# Patient Record
Sex: Female | Born: 1950 | Race: White | Hispanic: No | Smoking: Never smoker
Health system: Southern US, Community
[De-identification: ages and names within clinical notes are randomized; demographics above are authoritative.]

## PROBLEM LIST (undated history)

## (undated) DIAGNOSIS — I10 Essential (primary) hypertension: Secondary | ICD-10-CM

## (undated) DIAGNOSIS — K219 Gastro-esophageal reflux disease without esophagitis: Secondary | ICD-10-CM

## (undated) DIAGNOSIS — M5030 Other cervical disc degeneration, unspecified cervical region: Secondary | ICD-10-CM

## (undated) DIAGNOSIS — F419 Anxiety disorder, unspecified: Secondary | ICD-10-CM

## (undated) DIAGNOSIS — E7800 Pure hypercholesterolemia, unspecified: Secondary | ICD-10-CM

## (undated) DIAGNOSIS — Z87442 Personal history of urinary calculi: Secondary | ICD-10-CM

## (undated) DIAGNOSIS — F32A Depression, unspecified: Secondary | ICD-10-CM

## (undated) DIAGNOSIS — D649 Anemia, unspecified: Secondary | ICD-10-CM

## (undated) DIAGNOSIS — R0600 Dyspnea, unspecified: Secondary | ICD-10-CM

## (undated) DIAGNOSIS — M5136 Other intervertebral disc degeneration, lumbar region: Secondary | ICD-10-CM

## (undated) DIAGNOSIS — F329 Major depressive disorder, single episode, unspecified: Secondary | ICD-10-CM

## (undated) HISTORY — PX: KNEE SURGERY: SHX244

## (undated) HISTORY — PX: ANKLE SURGERY: SHX546

## (undated) HISTORY — DX: Gastro-esophageal reflux disease without esophagitis: K21.9

## (undated) HISTORY — DX: Anemia, unspecified: D64.9

## (undated) HISTORY — PX: COLONOSCOPY: SHX174

## (undated) HISTORY — PX: DILATION AND CURETTAGE OF UTERUS: SHX78

---

## 2006-10-23 ENCOUNTER — Inpatient Hospital Stay (HOSPITAL_COMMUNITY): Admission: EM | Admit: 2006-10-23 | Discharge: 2006-10-26 | Payer: Self-pay | Admitting: Emergency Medicine

## 2006-10-23 ENCOUNTER — Ambulatory Visit: Payer: Self-pay | Admitting: Cardiovascular Disease

## 2006-10-25 ENCOUNTER — Encounter (INDEPENDENT_AMBULATORY_CARE_PROVIDER_SITE_OTHER): Payer: Self-pay | Admitting: *Deleted

## 2006-10-25 ENCOUNTER — Ambulatory Visit: Payer: Self-pay | Admitting: Surgery

## 2009-10-04 ENCOUNTER — Emergency Department (HOSPITAL_COMMUNITY): Admission: EM | Admit: 2009-10-04 | Discharge: 2009-10-04 | Payer: Self-pay | Admitting: Emergency Medicine

## 2010-03-28 ENCOUNTER — Emergency Department (HOSPITAL_COMMUNITY)
Admission: EM | Admit: 2010-03-28 | Discharge: 2010-03-28 | Disposition: A | Discharge status: Home / Self Care | Payer: Medicaid Other | Attending: Emergency Medicine | Admitting: Emergency Medicine

## 2010-03-28 DIAGNOSIS — R1011 Right upper quadrant pain: Secondary | ICD-10-CM

## 2010-03-28 DIAGNOSIS — R109 Unspecified abdominal pain: Secondary | ICD-10-CM | POA: Insufficient documentation

## 2010-03-28 LAB — LIPASE, BLOOD: Lipase: 41 U/L (ref 11–59)

## 2010-03-28 LAB — COMPREHENSIVE METABOLIC PANEL
ALT: 24 U/L (ref 0–35)
Albumin: 3.7 g/dL (ref 3.5–5.2)
Alkaline Phosphatase: 74 U/L (ref 39–117)
CO2: 26 mEq/L (ref 19–32)
Calcium: 9.5 mg/dL (ref 8.4–10.5)
GFR calc non Af Amer: >60 mL/min (ref >60)
Total Protein: 8.1 g/dL (ref 6.0–8.3)

## 2010-03-28 LAB — URINALYSIS, ROUTINE W REFLEX MICROSCOPIC
Protein, ur: NEGATIVE mg/dL
Urine Glucose, Fasting: NEGATIVE mg/dL
Urobilinogen, UA: 0.2 mg/dL (ref 0.0–1.0)
pH: 5.5 (ref 5.0–8.0)

## 2010-03-28 LAB — CBC: RDW: 13.4 % (ref 11.5–15.5)

## 2010-03-28 LAB — DIFFERENTIAL
Eosinophils Relative: 2 % (ref 0–5)
Lymphocytes Relative: 39 % (ref 12–46)
Lymphs Abs: 3.8 10*3/uL (ref 0.7–4.0)
Monocytes Relative: 7 % (ref 3–12)
Neutrophils Relative %: 53 % (ref 43–77)

## 2010-03-29 ENCOUNTER — Other Ambulatory Visit (HOSPITAL_COMMUNITY): Payer: Self-pay | Admitting: Family Medicine

## 2010-03-29 ENCOUNTER — Ambulatory Visit (HOSPITAL_COMMUNITY): Payer: Medicaid Other

## 2010-03-29 DIAGNOSIS — R1011 Right upper quadrant pain: Secondary | ICD-10-CM

## 2010-04-04 ENCOUNTER — Encounter (HOSPITAL_COMMUNITY): Payer: Self-pay

## 2010-04-04 ENCOUNTER — Encounter (HOSPITAL_COMMUNITY)
Admission: RE | Admit: 2010-04-04 | Discharge: 2010-04-04 | Disposition: A | Discharge status: Home / Self Care | Payer: Medicaid Other | Source: Ambulatory Visit | Attending: Family Medicine | Admitting: Family Medicine

## 2010-04-04 DIAGNOSIS — R1011 Right upper quadrant pain: Secondary | ICD-10-CM | POA: Insufficient documentation

## 2010-04-04 HISTORY — DX: Essential (primary) hypertension: I10

## 2010-04-04 MED ORDER — TECHNETIUM TC 99M MEBROFENIN IV KIT
5.0000 | PACK | Freq: Once | INTRAVENOUS | Status: AC | PRN
  Administered 2010-04-04: 5 via INTRAVENOUS

## 2010-04-21 ENCOUNTER — Emergency Department (HOSPITAL_COMMUNITY): Payer: Medicaid Other

## 2010-04-21 ENCOUNTER — Emergency Department (HOSPITAL_COMMUNITY)
Admission: EM | Admit: 2010-04-21 | Discharge: 2010-04-22 | Disposition: A | Discharge status: Home / Self Care | Payer: Medicaid Other | Attending: Emergency Medicine | Admitting: Emergency Medicine

## 2010-04-21 DIAGNOSIS — F3289 Other specified depressive episodes: Secondary | ICD-10-CM | POA: Insufficient documentation

## 2010-04-21 DIAGNOSIS — Z79899 Other long term (current) drug therapy: Secondary | ICD-10-CM | POA: Insufficient documentation

## 2010-04-21 DIAGNOSIS — Z8673 Personal history of transient ischemic attack (TIA), and cerebral infarction without residual deficits: Secondary | ICD-10-CM | POA: Insufficient documentation

## 2010-04-21 DIAGNOSIS — R51 Headache: Secondary | ICD-10-CM | POA: Insufficient documentation

## 2010-04-21 DIAGNOSIS — I10 Essential (primary) hypertension: Secondary | ICD-10-CM | POA: Insufficient documentation

## 2010-04-21 DIAGNOSIS — R4182 Altered mental status, unspecified: Secondary | ICD-10-CM | POA: Insufficient documentation

## 2010-04-21 DIAGNOSIS — H538 Other visual disturbances: Secondary | ICD-10-CM | POA: Insufficient documentation

## 2010-04-21 DIAGNOSIS — R42 Dizziness and giddiness: Secondary | ICD-10-CM | POA: Insufficient documentation

## 2010-04-21 DIAGNOSIS — F329 Major depressive disorder, single episode, unspecified: Secondary | ICD-10-CM | POA: Insufficient documentation

## 2010-04-21 DIAGNOSIS — R7309 Other abnormal glucose: Secondary | ICD-10-CM | POA: Insufficient documentation

## 2010-04-21 LAB — BASIC METABOLIC PANEL
BUN: 13 mg/dL (ref 6–23)
CO2: 27 mEq/L (ref 19–32)
Calcium: 9.5 mg/dL (ref 8.4–10.5)
Chloride: 97 mEq/L (ref 96–112)
Creatinine, Ser: 0.59 mg/dL (ref 0.4–1.2)
GFR calc Af Amer: >60 mL/min (ref >60)
GFR calc Af Amer: >60 mL/min (ref >60)
GFR calc non Af Amer: >60 mL/min (ref >60)
GFR calc non Af Amer: >60 mL/min (ref >60)
Glucose, Bld: 125 mg/dL — ABNORMAL HIGH (ref 70–99)
Potassium: 4.5 mEq/L (ref 3.5–5.1)
Potassium: 4.2 mEq/L (ref 3.5–5.1)
Sodium: 134 mEq/L — ABNORMAL LOW (ref 135–145)

## 2010-04-21 LAB — POCT CARDIAC MARKERS
CKMB, poc: 3.5 ng/mL (ref 1.0–8.0)
Myoglobin, poc: 57.8 ng/mL (ref 12–200)
Troponin i, poc: <0.05 ng/mL (ref 0.00–0.09)
Troponin i, poc: <0.05 ng/mL (ref 0.00–0.09)

## 2010-04-21 LAB — URINALYSIS, ROUTINE W REFLEX MICROSCOPIC
Bilirubin Urine: NEGATIVE
Glucose, UA: 500 mg/dL — AB
Hgb urine dipstick: NEGATIVE
Ketones, ur: NEGATIVE mg/dL
Protein, ur: NEGATIVE mg/dL
pH: 5.5 (ref 5.0–8.0)

## 2010-04-21 LAB — CBC
HCT: 38.6 % (ref 36.0–46.0)
Hemoglobin: 12.8 g/dL (ref 12.0–15.0)
MCH: 31 pg (ref 26.0–34.0)
MCHC: 33.6 g/dL (ref 30.0–36.0)
MCHC: 33.2 g/dL (ref 30.0–36.0)
MCV: 92.2 fL (ref 78.0–100.0)
MCV: 93.5 fL (ref 78.0–100.0)
Platelets: 214 10*3/uL (ref 150–400)
Platelets: 209 10*3/uL (ref 150–400)
RBC: 4.18 MIL/uL (ref 3.87–5.11)
RBC: 4.13 MIL/uL (ref 3.87–5.11)
RDW: 13.5 % (ref 11.5–15.5)
WBC: 10.7 10*3/uL — ABNORMAL HIGH (ref 4.0–10.5)

## 2010-04-21 LAB — DIFFERENTIAL
Basophils Absolute: 0 10*3/uL (ref 0.0–0.1)
Basophils Absolute: 0 10*3/uL (ref 0.0–0.1)
Basophils Relative: 0 % (ref 0–1)
Basophils Relative: 0 % (ref 0–1)
Eosinophils Absolute: 0.2 10*3/uL (ref 0.0–0.7)
Eosinophils Relative: 1 % (ref 0–5)
Lymphocytes Relative: 33 % (ref 12–46)
Lymphs Abs: 3.5 10*3/uL (ref 0.7–4.0)
Monocytes Absolute: 0.6 10*3/uL (ref 0.1–1.0)
Monocytes Absolute: 0.8 10*3/uL (ref 0.1–1.0)
Monocytes Relative: 7 % (ref 3–12)
Neutro Abs: 6.2 10*3/uL (ref 1.7–7.7)
Neutrophils Relative %: 58 % (ref 43–77)

## 2010-04-21 LAB — BLOOD GAS, ARTERIAL
Acid-Base Excess: 1.5 mmol/L (ref 0.0–2.0)
Bicarbonate: 25.7 mEq/L — ABNORMAL HIGH (ref 20.0–24.0)
TCO2: 22.9 mmol/L (ref 0–100)
pCO2 arterial: 41.2 mmHg (ref 35.0–45.0)
pO2, Arterial: 86.2 mmHg (ref 80.0–100.0)

## 2010-04-21 LAB — PROTIME-INR
INR: 0.99 (ref 0.00–1.49)
Prothrombin Time: 13.3 seconds (ref 11.6–15.2)

## 2010-06-20 NOTE — H&P (Signed)
NAMEWRIGLEY, WINBORNE              ACCOUNT NO.:  [id number]   MEDICAL RECORD NO.:  [id number]          PATIENT TYPE:  INP   LOCATION:  5511                         FACILITY:  [facility]   PHYSICIAN:  Hettie Holstein, D.O.    DATE OF BIRTH:  [birth year]   DATE OF ADMISSION:  10/23/2006  DATE OF DISCHARGE:                              HISTORY & PHYSICAL   PRIMARY CARE PHYSICIAN:  Unassigned, but this is Dr. Lavera Guise and Dixie Dials.   CHIEF COMPLAINT:  Gait difficulty and alteration in mental status.   HISTORY OF PRESENT ILLNESS:  Mrs. 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  She  described the preceding events as walking into the classroom.  She  noticed a child sitting at a table drawing, working on some art and she  noticed the table began spinning and ultimately, she passed out.  She  woke up confused and there were some reports of speech slurring and  right hand tremor and a numbness that persisted to this time.  Then 911  was dispatched.  She went to [facility] where she underwent CT,  EKG without significant findings and ultimately sent home to follow-up  with a neurosurgeon and her PCP.  They contacted a neurosurgeon and  could not be seen until September 30 and subsequently they re-presented  to their primary care physician's office at one o'clock.  Ultimately her  symptoms worsened, according to her, and she had some paranoid behavior,  according to her daughter, and worsening with numbness into the hands  and slurred speech.  She became weak with ambulation.  Of note, she had  had abnormal uterine bleeding postmenopausal and had been undergoing  evaluation through her gynecologist, who referred her for an ultrasound  and the ultrasound revealed a left 3-cm hypoechoic mass with  recommendations for an MRI.   PAST MEDICAL HISTORY:  Significant only for hypertension, depression.  She has had some arthroscopy of her right knee and right ankle.  She had  some surgery about 25 years ago with some pins.  She retains her  gallbladder, appendix and uterus.  She has no other surgical history.   MEDICATIONS:  Medications at home listed as:  1. Atenolol 50 mg daily.  2. Effexor XR 175 mg daily.  3. Wellbutrin 175 mg daily.   ALLERGIES:  SHE HAS NO KNOWN DRUG ALLERGIES.   SOCIAL HISTORY:  She does not smoke or drink.  She is married with three  out of four living children.  Denies tobacco or alcohol.  She works as a  Pension scheme manager.   FAMILY HISTORY:  Mother died with colon cancer at age 60 as well as she  had a stroke.  Father:  She does not know his history.  Her child died  with complications of diverticulitis and a blood clot.  There are no  other blood clots in the family.  REVIEW OF SYSTEMS:  She had been in her usual state of health.  Stable  weight, no nausea and vomiting, diarrhea.  She has had an abnormal  uterine bleeding as described above.  Otherwise no other complaints with  the exception of that described in the history of presenting illness.   PHYSICAL EXAMINATION:  GENERAL:  In the emergency department, she was  examined and is alert and oriented, in no acute distress.  She was able  to ambulate  to the bathroom with assistance.  HEENT:  Revealed her head to be normocephalic, atraumatic.  Extraocular  muscles are intact.  Neck was supple, nontender.  No palpable mass.  CARDIOVASCULAR EXAM:  Normal S1, S2.  RESPIRATORY:  Lungs were clear to auscultation bilaterally with normal  effort.  ABDOMEN:  Soft, nontender, no rebound or guarding.  LOWER EXTREMITIES:  Revealed no edema.  She does have some tattoos on  her extremities.  Calf is nontender.  Peripheral pulses are symmetrical  and palpable.   LABORATORY DATA:  Hemoglobin was 11,  platelet count 252,000, MCV of 90.  Urinalysis was negative as well as her EKG data reviewed from [facility] revealed mild cardiomegaly on the chest x-ray.  She had a CT of  her head without contrast that revealed no acute findings and a basic  metabolic panel that revealed sodium of 140, potassium of 3.8, chloride  of 109, CO2 of 26, BUN is 10 and creatinine  is 0.52.  Cardiac markers  were negative.  Glucose as noted above 122, PT/INR were normal, WBC was  7.3.  Urinalysis was negative and she had C-spine CT that also revealed  no acute findings.  Ultrasound as described above.   ASSESSMENT:  1. Neurologic symptoms:  Transient ischemic attack, rule out stroke.  2. Hypertension.  3. Depression.  4. Obesity.  5. Abnormal uterine bleeding.  6. Questionable ovarian mass.   PLAN:  At this time, we will pursue further diagnostic workup to  evaluate the etiology of her neuro symptoms.  We will put her on the  telemetry floor.  Check an MRI.  In addition, we will also evaluate this  mass further with an MRI of this left ovary and we will follow her  clinical course.  Continue her medications.  We will need to clarify the  dosages of her Effexor and Wellbutrin.  These may be contributing  factors if they are at the doses stated on the emergency room sheet of  175 each daily.      Hettie Holstein, D.O.  Electronically Signed     ESS/MEDQ  D:  10/23/2006  T:  10/24/2006  Job:  [id number]

## 2010-06-23 NOTE — Discharge Summary (Signed)
[name], [name]              ACCOUNT NO.:  [id number]   MEDICAL RECORD NO.:  [id number]          PATIENT TYPE:  INP   LOCATION:  5511                         FACILITY:  [facility]   PHYSICIAN:  Lonia Blood, M.D.       DATE OF BIRTH:  1950-07-10   DATE OF ADMISSION:  10/23/2006  DATE OF DISCHARGE:  10/26/2006                               DISCHARGE SUMMARY   The patient's primary care physician Dr. Jonette Eva in [place], [place].   DISCHARGE DIAGNOSES:  1. Transient ischemic attack.  2. Cervical spine disk disease.  3. Hemorrhagic cyst of the left ovary measuring 2.8 cm in diameter.  4. Obesity.  5. Hypertension.  6. Hyperlipidemia.   DISCHARGE MEDICATIONS:  1. Metformin 500 mg twice a day.  2. Aspirin 325 mg daily.  3. Simvastatin 40 mg at bedtime.  4. Effexor 150 mg daily.  5. Wellbutrin 150 mg daily.  6. Lexapro 50 mg daily.   CONDITION ON DISCHARGE:  [name] was discharged in good condition.  At the time of discharge, the patient was instructed to follow-up  a  diabetic diet.  The patient was told to follow up with her primary care  physician in regards to her diabetes medication adjustments and referal  to diabetes education.  The patient was also referred to Dr. Danielle Dess from  neurosurgery for her cervical spine disease.   PROCEDURE:  During this admission:  1. October 24, 2006, the patient had an MRI of the brain, findings      of no acute stroke, no hemodynamically significant stenosis.  2. October 24, 2006, MRI of the pelvis, findings of hemorrhagic cyst      of left ovary.  3. October 25, 2006, cervical spine MRI, findings of central disk      protrusion at C5-C6, C6-C7, C7-T1, mild cord compression.  4. October 25, 2006, transthoracic echocardiogram findings of      ejection fraction of 50-55%, no echocardiographic evidence for      cardiac source of embolism.  5. October 25, 2006, carotid ultrasound, findings of no significant      right ICA stenosis, no  significant left ICA stenosis.   CONSULTATION:  No consultations obtained.  For admission history and  physical, refer to dictated H&P done by Dr. Angelena Sole October 23, 2006.   HOSPITAL COURSE:  1. Transient ischemic attack.  [name] presented with complaints      of vertigo  - almost passing out.  She was transferred emergently      to [facility] from [place], [place] ER.  She was admitted and      had MRI, MRA, transthoracic echocardiogram and carotid ultrasound.      No clear source for the patient's symptoms was identified.  The      patient was kept on telemetry for 48 hours without any arrhythmias.      The patient's symptoms have improved during her hospital stay.  We      had diagnosed the patient with new onset diabetes as well as      significant hyperlipidemia.  We have  provided the patient with      diabetes teaching and education and measured the hemoglobin A1c      which was 11.9.  LDL measured at 155.  We have started the patient      on metformin, aspirin and Zocor and instructed her to follow up      with her primary care physician.  2. Old right arm numbness.  This was evaluated with a MRI of the      cervical spine.  The patient was told that she may have findings on      the C spine MRI to explain her right arm numbness, but she had      chosen to follow up with neurosurgeon as an outpatient.  3. Depression.  We have continued [name] on Effexor and      Wellbutrin without changes.  4. Right ovarian cyst.  This was evaluated with a MRI of the pelvis,      the findings which showed hemorrhagic cyst.  The patient was told      that she must have follow-up with her primary gynecologist as well      as a follow-up imaging in a month to ensure that the cyst is      stable.      Lonia Blood, M.D.  Electronically Signed     SL/MEDQ  D:  11/28/2006  T:  11/29/2006  Job:  [id number]

## 2010-11-16 LAB — RAPID URINE DRUG SCREEN, HOSP PERFORMED
Amphetamines: NOT DETECTED
Barbiturates: NOT DETECTED
Benzodiazepines: NOT DETECTED
Cocaine: NOT DETECTED
Opiates: POSITIVE — AB
Tetrahydrocannabinol: NOT DETECTED

## 2010-11-16 LAB — DIFFERENTIAL
Basophils Absolute: 0
Basophils Relative: 1
Eosinophils Absolute: 0.1
Eosinophils Relative: 2
Lymphocytes Relative: 36
Lymphs Abs: 2.6
Monocytes Absolute: 0.4
Monocytes Relative: 6
Neutro Abs: 3.9
Neutrophils Relative %: 55

## 2010-11-16 LAB — CBC
HCT: 34.4 — ABNORMAL LOW
Hemoglobin: 11.8 — ABNORMAL LOW
MCHC: 34.3
MCV: 90.9
MCV: 91.6
Platelets: 252
Platelets: 276
RBC: 3.79 — ABNORMAL LOW
RDW: 13.5
WBC: 7.1
WBC: 7.2

## 2010-11-16 LAB — COMPREHENSIVE METABOLIC PANEL
AST: 26
Albumin: 3.2 — ABNORMAL LOW
Alkaline Phosphatase: 60
BUN: 11
CO2: 26
Chloride: 105
GFR calc Af Amer: >60
GFR calc non Af Amer: >60
Potassium: 3.7
Total Bilirubin: 0.6

## 2010-11-16 LAB — COMPREHENSIVE METABOLIC PANEL WITH GFR
ALT: 20
Calcium: 8.7
Creatinine, Ser: 0.53
Glucose, Bld: 101 — ABNORMAL HIGH
Sodium: 139
Total Protein: 6.6

## 2010-11-16 LAB — URINALYSIS, ROUTINE W REFLEX MICROSCOPIC
Bilirubin Urine: NEGATIVE
Glucose, UA: NEGATIVE
Hgb urine dipstick: NEGATIVE
Ketones, ur: NEGATIVE
Nitrite: NEGATIVE
Protein, ur: NEGATIVE
Specific Gravity, Urine: 1.016
Urobilinogen, UA: 0.2
pH: 5

## 2010-11-16 LAB — LIPID PANEL
HDL: 38 — ABNORMAL LOW
Total CHOL/HDL Ratio: 6.4
Triglycerides: 261 — ABNORMAL HIGH
VLDL: 52 — ABNORMAL HIGH

## 2010-11-16 LAB — URINE MICROSCOPIC-ADD ON

## 2010-11-16 LAB — HEMOGLOBIN A1C: Hgb A1c MFr Bld: 11.9 — ABNORMAL HIGH

## 2010-11-16 LAB — GLUCOSE, RANDOM: Glucose, Bld: 132 — ABNORMAL HIGH

## 2011-03-08 ENCOUNTER — Other Ambulatory Visit: Payer: Self-pay | Admitting: Family Medicine

## 2011-03-08 DIAGNOSIS — R928 Other abnormal and inconclusive findings on diagnostic imaging of breast: Secondary | ICD-10-CM

## 2011-03-19 ENCOUNTER — Ambulatory Visit
Admission: RE | Admit: 2011-03-19 | Discharge: 2011-03-19 | Disposition: A | Discharge status: Home / Self Care | Payer: Medicaid Other | Source: Ambulatory Visit | Attending: Family Medicine | Admitting: Family Medicine

## 2011-03-19 DIAGNOSIS — R928 Other abnormal and inconclusive findings on diagnostic imaging of breast: Secondary | ICD-10-CM

## 2011-03-19 MED ORDER — GADOBENATE DIMEGLUMINE 529 MG/ML IV SOLN
20.0000 mL | Freq: Once | INTRAVENOUS | Status: AC | PRN
  Administered 2011-03-19: 20 mL via INTRAVENOUS

## 2011-04-25 ENCOUNTER — Encounter (HOSPITAL_COMMUNITY): Payer: Self-pay | Admitting: *Deleted

## 2011-04-25 ENCOUNTER — Emergency Department (HOSPITAL_COMMUNITY)
Admission: EM | Admit: 2011-04-25 | Discharge: 2011-04-25 | Disposition: A | Discharge status: Home / Self Care | Payer: Medicaid Other | Attending: Emergency Medicine | Admitting: Emergency Medicine

## 2011-04-25 ENCOUNTER — Emergency Department (HOSPITAL_COMMUNITY): Payer: Medicaid Other

## 2011-04-25 ENCOUNTER — Other Ambulatory Visit: Payer: Self-pay

## 2011-04-25 DIAGNOSIS — F411 Generalized anxiety disorder: Secondary | ICD-10-CM | POA: Insufficient documentation

## 2011-04-25 DIAGNOSIS — J40 Bronchitis, not specified as acute or chronic: Secondary | ICD-10-CM | POA: Insufficient documentation

## 2011-04-25 DIAGNOSIS — F329 Major depressive disorder, single episode, unspecified: Secondary | ICD-10-CM | POA: Insufficient documentation

## 2011-04-25 DIAGNOSIS — I10 Essential (primary) hypertension: Secondary | ICD-10-CM | POA: Insufficient documentation

## 2011-04-25 DIAGNOSIS — F3289 Other specified depressive episodes: Secondary | ICD-10-CM | POA: Insufficient documentation

## 2011-04-25 DIAGNOSIS — E7800 Pure hypercholesterolemia, unspecified: Secondary | ICD-10-CM | POA: Insufficient documentation

## 2011-04-25 DIAGNOSIS — E119 Type 2 diabetes mellitus without complications: Secondary | ICD-10-CM | POA: Insufficient documentation

## 2011-04-25 HISTORY — DX: Anxiety disorder, unspecified: F41.9

## 2011-04-25 HISTORY — DX: Depression, unspecified: F32.A

## 2011-04-25 HISTORY — DX: Major depressive disorder, single episode, unspecified: F32.9

## 2011-04-25 HISTORY — DX: Pure hypercholesterolemia, unspecified: E78.00

## 2011-04-25 LAB — BASIC METABOLIC PANEL
BUN: 11 mg/dL (ref 6–23)
CO2: 29 mEq/L (ref 19–32)
Calcium: 9.5 mg/dL (ref 8.4–10.5)
Creatinine, Ser: 0.64 mg/dL (ref 0.50–1.10)
Glucose, Bld: 119 mg/dL — ABNORMAL HIGH (ref 70–99)

## 2011-04-25 LAB — CBC
HCT: 38.1 % (ref 36.0–46.0)
Hemoglobin: 12.2 g/dL (ref 12.0–15.0)
MCH: 29.5 pg (ref 26.0–34.0)
MCV: 92.3 fL (ref 78.0–100.0)
RBC: 4.13 MIL/uL (ref 3.87–5.11)

## 2011-04-25 MED ORDER — ALBUTEROL SULFATE HFA 108 (90 BASE) MCG/ACT IN AERS
2.0000 | INHALATION_SPRAY | RESPIRATORY_TRACT | Status: DC | PRN
  Administered 2011-04-25: 2 via RESPIRATORY_TRACT
  Filled 2011-04-25: qty 6.7

## 2011-04-25 MED ORDER — AEROCHAMBER Z-STAT PLUS/MEDIUM MISC
1.0000 | Freq: Once | Status: AC
  Administered 2011-04-25: 1

## 2011-04-25 MED ORDER — PREDNISONE 20 MG PO TABS
60.0000 mg | ORAL_TABLET | Freq: Once | ORAL | Status: AC
  Administered 2011-04-25: 60 mg via ORAL
  Filled 2011-04-25: qty 3

## 2011-04-25 MED ORDER — PREDNISONE 20 MG PO TABS: 20.0000 mg | ORAL_TABLET | Freq: Two times a day (BID) | ORAL | Status: AC

## 2011-04-25 NOTE — Discharge Instructions (Signed)
Use the inhaler, 2 puffs every 3-4 hours as needed for cough or trouble breathing. See your doctor if not better in 2-3 days.  Bronchitis Bronchitis is a problem of the air tubes leading to your lungs. This problem makes it hard for air to get in and out of the lungs. You may cough a lot because your air tubes are narrow. Going without care can cause lasting (chronic) bronchitis. HOME CARE   Drink enough fluids to keep your pee (urine) clear or pale yellow.   Use a cool mist humidifier.   Quit smoking if you smoke. If you keep smoking, the bronchitis might not get better.   Only take medicine as told by your doctor.  GET HELP RIGHT AWAY IF:   Coughing keeps you awake.   You start to wheeze.   You become more sick or weak.   You have a hard time breathing or get short of breath.   You cough up blood.   Coughing lasts more than 2 weeks.   You have a fever.   Your baby is older than 3 months with a rectal temperature of 102 F (38.9 C) or higher.   Your baby is 33 months old or younger with a rectal temperature of 100.4 F (38 C) or higher.  MAKE SURE YOU:  Understand these instructions.   Will watch your condition.   Will get help right away if you are not doing well or get worse.  Document Released: 07/11/2007 Document Revised: 01/11/2011 Document Reviewed: 12/24/2008 [facility] Patient Information 2012 [place], [place].

## 2011-04-25 NOTE — ED Notes (Signed)
Cold symptoms began Friday. Productive cough at times, yellow and green in color. Substernal CP, intermittent, since Saturday.  also states pain goes through to back. Tingling and burning pain to left upper arm. Pt describes CP as "something setting on it (chest), heavy"

## 2011-04-25 NOTE — ED Provider Notes (Addendum)
History   This chart was scribed for Flint Melter, MD by Sofie Rower. The patient was seen in room APA17/APA17 and the patient's care was started at 7:38PM.    CSN: [id number]  Arrival date & time 04/25/11  1800   None     Chief Complaint  Patient presents with  . Chest Pain    (Consider location/radiation/quality/duration/timing/severity/associated sxs/prior treatment) HPI  [name] is a 61 y.o. female who presents to the Emergency Department complaining of moderate, intermittent chest pain located sub sternally onset four days ago with associated symptoms of headache, watery eyes, cough, myalgia. Pt states chest pain last "1-2 minutes". Pain comes and goes "every couple of hours". Pt states "pain radiates to the back." Modifying factors include taking benadryl with moderate relief, movement and exertion which intensifies the pain. Pt has a hx of hypertension, anxiety, bronchitis, pneumonia.  Pt denies asthma, swelling  PCP is Dr. Margo Aye. Psychiatrist is at [facility] (pt visits every 1-2 months).   Past Medical History  Diagnosis Date  . Hypertension   . Diabetes mellitus   . Hypercholesteremia   . Anxiety   . Depression     Past Surgical History  Procedure Date  . Knee surgery   . Ankle surgery   . Dilation and curettage of uterus     No family history on file.  History  Substance Use Topics  . Smoking status: Never Smoker   . Smokeless tobacco: Not on file  . Alcohol Use: No    OB History    Grav Para Term Preterm Abortions TAB SAB Ect Mult Living                  Review of Systems  10 Systems reviewed and are negative for acute change except as noted in the HPI.   Allergies  Review of patient's allergies indicates no known allergies.  Home Medications   Current Outpatient Rx  Name Route Sig Dispense Refill  . GLIMEPIRIDE 1 MG PO TABS Oral Take 1 mg by mouth daily before breakfast.    . ISOSORBIDE MONONITRATE ER 30 MG PO  TB24 Oral Take 30 mg by mouth every morning.    Marland Kitchen METFORMIN HCL 1000 MG PO TABS Oral Take 1,000 mg by mouth 2 (two) times daily with a meal.    . METOPROLOL SUCCINATE ER 25 MG PO TB24 Oral Take 25 mg by mouth every morning.    Marland Kitchen OMEPRAZOLE 20 MG PO CPDR Oral Take 20 mg by mouth every morning.    Marland Kitchen PAROXETINE HCL 20 MG PO TABS Oral Take 20 mg by mouth every morning.    Marland Kitchen SIMVASTATIN 20 MG PO TABS Oral Take 20 mg by mouth at bedtime.    Marland Kitchen PREDNISONE 20 MG PO TABS Oral Take 1 tablet (20 mg total) by mouth 2 (two) times daily. 10 tablet 0    BP 134/83  Pulse 77  Temp(Src) 98.5 F (36.9 C) (Oral)  Resp 16  Ht 5' (1.524 m)  Wt 208 lb (94.348 kg)  BMI 40.62 kg/m2  SpO2 96%  Physical Exam  Nursing note and vitals reviewed. Constitutional: She is oriented to person, place, and time. She appears well-developed and well-nourished.  HENT:  Head: Normocephalic and atraumatic.  Right Ear: External ear normal.  Left Ear: External ear normal.  Nose: Nose normal.  Mouth/Throat: Oropharynx is clear and moist.  Eyes: Conjunctivae and EOM are normal. Pupils are equal, round, and reactive to light.  Neck: Normal range of motion and phonation normal. Neck supple.  Cardiovascular: Normal rate, regular rhythm, normal heart sounds and intact distal pulses.   Pulmonary/Chest: Effort normal and breath sounds normal. She has no wheezes. She has no rales. She exhibits no tenderness.       Air movement is decreased.   Abdominal: Soft. She exhibits no distension. There is no tenderness. There is no guarding.  Musculoskeletal: Normal range of motion. She exhibits no edema and no tenderness.  Neurological: She is alert and oriented to person, place, and time. She has normal strength. She exhibits normal muscle tone.  Skin: Skin is warm and dry.  Psychiatric: She has a normal mood and affect. Her behavior is normal. Judgment and thought content normal.    ED Course  Procedures (including critical care  time)  DIAGNOSTIC STUDIES: Oxygen Saturation is 96% on room air, adequate by my interpretation.    COORDINATION OF CARE:  Emergency department treatment: Albuterol inhaler, with chamber, prednisone  Repeat vital signs are normal  Date: 04/25/2011  Rate: 69  Rhythm: normal sinus rhythm  QRS Axis: normal  Intervals: normal  ST/T Wave abnormalities: normal  Conduction Disutrbances:none  Narrative Interpretation:   Old EKG Reviewed: none available   Labs Reviewed  BASIC METABOLIC PANEL - Abnormal; Notable for the following:    Glucose, Bld 119 (*)    All other components within normal limits  CBC  TROPONIN I   Dg Chest 2 View  04/25/2011  *RADIOLOGY REPORT*  Clinical Data: Chest pain and shortness of breath  CHEST - 2 VIEW  Comparison: 10/04/2009  Findings: The heart size and mediastinal contours are within normal limits.  Both lungs are clear. Mild multilevel thoracic spondylosis identified.  IMPRESSION: Negative exam.  Original Report Authenticated By: Rosealee Albee, M.D.     1. Bronchitis     7:42PM- EDP at bedside discusses treatment plan.   MDM  Evaluation is consistent with bronchitis. No evidence for pneumonia, metabolic instability or apparent serious bacterial infection. She is a smoker, therefore, antibiotics are not indicated         I personally performed the services described in this documentation, which was scribed in my presence. The recorded information has been reviewed and considered.    Plan: Home Medications- Prednisone and Albuterol; Home Treatments- rest, fluids; Recommended follow up- PCP prn   Flint Melter, MD 04/25/11 2011  Flint Melter, MD 04/25/11 [phone]

## 2011-08-27 ENCOUNTER — Other Ambulatory Visit: Payer: Self-pay | Admitting: Family Medicine

## 2011-08-27 DIAGNOSIS — N63 Unspecified lump in unspecified breast: Secondary | ICD-10-CM

## 2011-08-27 DIAGNOSIS — Z09 Encounter for follow-up examination after completed treatment for conditions other than malignant neoplasm: Secondary | ICD-10-CM

## 2011-08-31 ENCOUNTER — Other Ambulatory Visit: Payer: Self-pay | Admitting: Family Medicine

## 2011-08-31 DIAGNOSIS — N63 Unspecified lump in unspecified breast: Secondary | ICD-10-CM

## 2011-08-31 DIAGNOSIS — Z09 Encounter for follow-up examination after completed treatment for conditions other than malignant neoplasm: Secondary | ICD-10-CM

## 2011-09-07 ENCOUNTER — Encounter (HOSPITAL_COMMUNITY): Payer: Self-pay | Admitting: *Deleted

## 2011-09-07 ENCOUNTER — Emergency Department (HOSPITAL_COMMUNITY)
Admission: EM | Admit: 2011-09-07 | Discharge: 2011-09-07 | Disposition: A | Discharge status: Home / Self Care | Payer: Medicaid Other | Attending: Emergency Medicine | Admitting: Emergency Medicine

## 2011-09-07 ENCOUNTER — Emergency Department (HOSPITAL_COMMUNITY): Payer: Medicaid Other

## 2011-09-07 DIAGNOSIS — I10 Essential (primary) hypertension: Secondary | ICD-10-CM | POA: Insufficient documentation

## 2011-09-07 DIAGNOSIS — Z79899 Other long term (current) drug therapy: Secondary | ICD-10-CM | POA: Insufficient documentation

## 2011-09-07 DIAGNOSIS — F341 Dysthymic disorder: Secondary | ICD-10-CM | POA: Insufficient documentation

## 2011-09-07 DIAGNOSIS — E7800 Pure hypercholesterolemia, unspecified: Secondary | ICD-10-CM | POA: Insufficient documentation

## 2011-09-07 DIAGNOSIS — M545 Low back pain, unspecified: Secondary | ICD-10-CM | POA: Insufficient documentation

## 2011-09-07 DIAGNOSIS — E119 Type 2 diabetes mellitus without complications: Secondary | ICD-10-CM | POA: Insufficient documentation

## 2011-09-07 HISTORY — DX: Other cervical disc degeneration, unspecified cervical region: M50.30

## 2011-09-07 HISTORY — DX: Other intervertebral disc degeneration, lumbar region: M51.36

## 2011-09-07 MED ORDER — METHOCARBAMOL 500 MG PO TABS: 1000.0000 mg | ORAL_TABLET | Freq: Four times a day (QID) | ORAL | Status: AC | PRN

## 2011-09-07 MED ORDER — OXYCODONE-ACETAMINOPHEN 5-325 MG PO TABS
1.0000 | ORAL_TABLET | Freq: Once | ORAL | Status: AC
  Administered 2011-09-07: 1 via ORAL
  Filled 2011-09-07: qty 1

## 2011-09-07 MED ORDER — OXYCODONE-ACETAMINOPHEN 5-325 MG PO TABS: ORAL_TABLET | ORAL | Status: AC

## 2011-09-07 NOTE — ED Notes (Signed)
Rt side low back pain , increased pain with movement. No injury.

## 2011-09-07 NOTE — ED Provider Notes (Signed)
History     CSN: [id number]  Arrival date & time 09/07/11  1949   First MD Initiated Contact with Patient 09/07/11 1956      Chief Complaint  Patient presents with  . Back Pain    HPI Pt was seen at 1955.  Per pt, c/o gradual onset and persistence of constant acute flair of her chronic low back "pain" for the past 2 monts.  Denies any change in her usual chronic pain pattern.  Pain worsens with palpation of the area and body position changes.  States pain worsened tonight after she was standing at the sink washing dishes.  States she "took some old lortab" with transient relief.  Endorses she has been eval by her PMD in the past few months but "forgot to mention" her LBP.  Denies incont/retention of bowel or bladder, no saddle anesthesia, no focal motor weakness, no tingling/numbness in extremities, no fevers, no injury, no abd pain, no flank pain, no dysuria, no N/V/D.  The symptoms have been associated with no other complaints.    PMD:  [facility] Past Medical History  Diagnosis Date  . Hypertension   . Diabetes mellitus   . Hypercholesteremia   . Anxiety   . Depression   . DDD (degenerative disc disease), cervical   . DDD (degenerative disc disease), lumbar     Past Surgical History  Procedure Date  . Knee surgery   . Ankle surgery   . Dilation and curettage of uterus     History  Substance Use Topics  . Smoking status: Never Smoker   . Smokeless tobacco: Not on file  . Alcohol Use: No    Review of Systems ROS: Statement: All systems negative except as marked or noted in the HPI; Constitutional: Negative for fever and chills. ; ; Eyes: Negative for eye pain, redness and discharge. ; ; ENMT: Negative for ear pain, hoarseness, nasal congestion, sinus pressure and sore throat. ; ; Cardiovascular: Negative for chest pain, palpitations, diaphoresis, dyspnea and peripheral edema. ; ; Respiratory: Negative for cough, wheezing and stridor. ; ; Gastrointestinal: Negative for  nausea, vomiting, diarrhea, abdominal pain, blood in stool, hematemesis, jaundice and rectal bleeding. ; ; Genitourinary: Negative for dysuria, flank pain and hematuria. ; ; Musculoskeletal: +LBP. Negative for neck pain. Negative for swelling and trauma.; ; Skin: Negative for pruritus, rash, abrasions, blisters, bruising and skin lesion.; ; Neuro: Negative for headache, lightheadedness and neck stiffness. Negative for weakness, altered level of consciousness , altered mental status, extremity weakness, paresthesias, involuntary movement, seizure and syncope.     Allergies  Review of patient's allergies indicates no known allergies.  Home Medications   Current Outpatient Rx  Name Route Sig Dispense Refill  . GLIMEPIRIDE 1 MG PO TABS Oral Take 1 mg by mouth daily before breakfast.    . ISOSORBIDE MONONITRATE ER 30 MG PO TB24 Oral Take 30 mg by mouth every morning.    Marland Kitchen METFORMIN HCL 1000 MG PO TABS Oral Take 1,000 mg by mouth 2 (two) times daily with a meal.    . METOPROLOL SUCCINATE ER 25 MG PO TB24 Oral Take 25 mg by mouth every morning.    Marland Kitchen OMEPRAZOLE 20 MG PO CPDR Oral Take 20 mg by mouth every morning.    Marland Kitchen PAROXETINE HCL 20 MG PO TABS Oral Take 20 mg by mouth every morning.    Marland Kitchen SIMVASTATIN 20 MG PO TABS Oral Take 20 mg by mouth at bedtime.  BP 140/68  Pulse 92  Resp 20  Ht 5' (1.524 m)  Wt 200 lb (90.719 kg)  BMI 39.06 kg/m2  SpO2 98%  Physical Exam 2000: Physical examination:  Nursing notes reviewed; Vital signs and O2 SAT reviewed;  Constitutional: Well developed, Well nourished, Well hydrated, In no acute distress; Head:  Normocephalic, atraumatic; Eyes: EOMI, PERRL, No scleral icterus; ENMT: Mouth and pharynx normal, Mucous membranes moist; Neck: Supple, Full range of motion, No lymphadenopathy; Cardiovascular: Regular rate and rhythm, No gallop; Respiratory: Breath sounds clear & equal bilaterally, No wheezes.  Speaking full sentences with ease, Normal respiratory  effort/excursion; Chest: Nontender, Movement normal; Abdomen: Soft, Nontender, Nondistended, Normal bowel sounds; Genitourinary: No CVA tenderness; Spine:  No midline CS, TS, LS tenderness.  +TTP right lower lumbar paraspinal muscles and SI joint area.;; Extremities: Pulses normal, No tenderness, No edema, No calf edema or asymmetry.; Neuro: AA&Ox3, Major CN grossly intact.  Speech clear. Strength 5/5 equal bilat UE's and LE's, including great toe dorsiflexion.  DTR 2/4 equal bilat UE's and LE's.  No gross sensory deficits.  Neg straight leg raises bilat.;.; Skin: Color normal, Warm, Dry.   ED Course  Procedures      MDM  MDM Reviewed: nursing note, vitals and previous chart Interpretation: x-ray   Dg Lumbar Spine Complete 09/07/2011  *RADIOLOGY REPORT*  Clinical Data: Low back pain  LUMBAR SPINE - COMPLETE 4+ VIEW  Comparison: None  Findings: There is a mild anterolisthesis of L4-L5. There is mild multilevel disc space narrowing and ventral endplate spurring.  The vertebral body heights are well preserved.  Facet hypertrophy and degenerative changes noted.  No acute fracture or subluxation identified.  IMPRESSION:  1.  Lumbar spondylosis. 2.  No acute findings.  Original Report Authenticated By: Rosealee Albee, M.D.      2030:   No concerning signs on exam: VSS, abd NT, strong peripheral pulses and no focal neuro deficits on exam.  XR with DDD.  Appears msk pain at this time, will tx symptomatically.  Dx testing d/w pt and family.  Questions answered.  Verb understanding, agreeable to d/c home with outpt f/u.         Laray Anger, DO 09/08/11 1825

## 2011-09-07 NOTE — ED Notes (Signed)
Pt reports right lower back pain for the last 2 months. Increased pain tonight.

## 2011-09-07 NOTE — ED Notes (Signed)
Pt alert & oriented x4, Patient given discharge instructions, paperwork & prescription(s). Patient instructed to stop at the registration desk to finish any additional paperwork. Patient verbalized understanding. Pt left department w/ no further questions.  

## 2011-09-17 ENCOUNTER — Ambulatory Visit
Admission: RE | Admit: 2011-09-17 | Discharge: 2011-09-17 | Disposition: A | Discharge status: Home / Self Care | Payer: Medicaid Other | Source: Ambulatory Visit | Attending: Family Medicine | Admitting: Family Medicine

## 2011-09-17 ENCOUNTER — Other Ambulatory Visit: Payer: Medicaid Other

## 2011-09-17 DIAGNOSIS — N63 Unspecified lump in unspecified breast: Secondary | ICD-10-CM

## 2011-09-17 DIAGNOSIS — Z09 Encounter for follow-up examination after completed treatment for conditions other than malignant neoplasm: Secondary | ICD-10-CM

## 2012-02-29 ENCOUNTER — Other Ambulatory Visit: Payer: Self-pay | Admitting: Family Medicine

## 2012-02-29 DIAGNOSIS — N63 Unspecified lump in unspecified breast: Secondary | ICD-10-CM

## 2012-03-14 ENCOUNTER — Ambulatory Visit
Admission: RE | Admit: 2012-03-14 | Discharge: 2012-03-14 | Disposition: A | Discharge status: Home / Self Care | Payer: Medicaid Other | Source: Ambulatory Visit | Attending: Family Medicine | Admitting: Family Medicine

## 2012-03-14 DIAGNOSIS — N63 Unspecified lump in unspecified breast: Secondary | ICD-10-CM

## 2012-05-11 IMAGING — US US ABDOMEN COMPLETE
1 series · 13 of 25 positions shown · non-contrast
Comparison: None

CLINICAL DATA: Right upper quadrant abdominal pain

ABDOMINAL ULTRASOUND COMPLETE

[Series 1: us abdomen complete · 0.30mm/px · 13 of 69 slices shown]
[im 1/69]
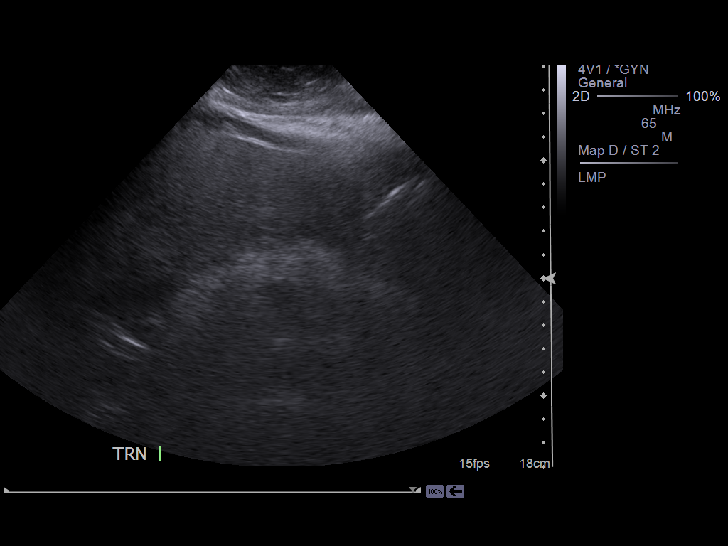
[im 6/69]
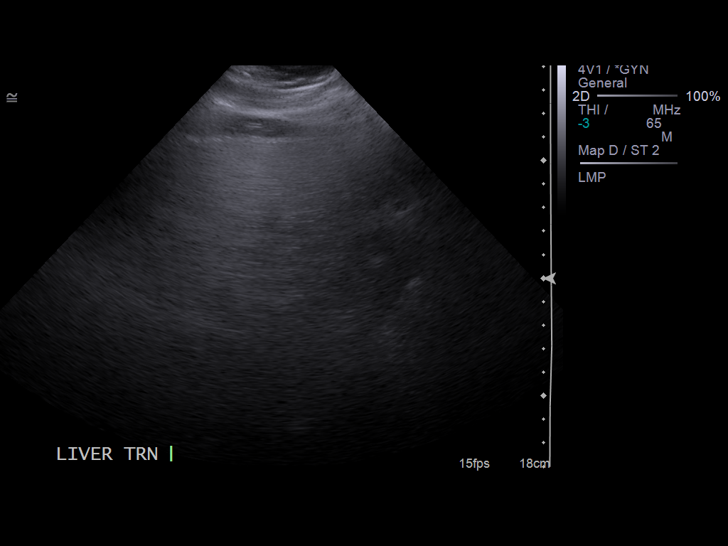
[im 12/69]
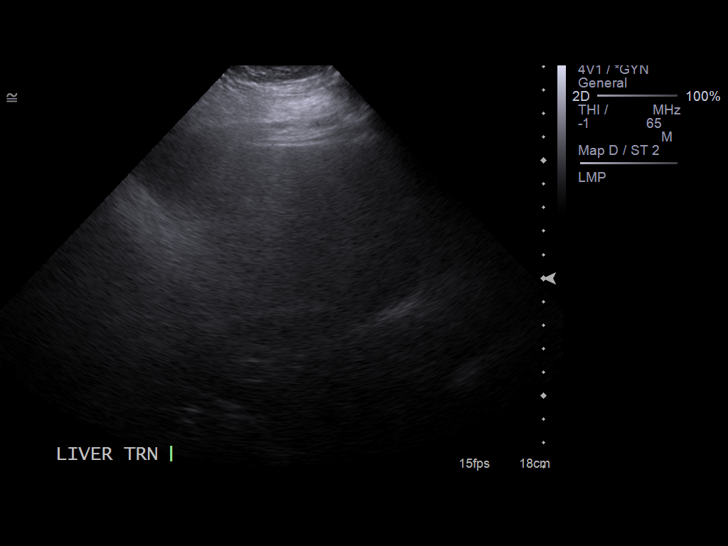
[im 18/69]
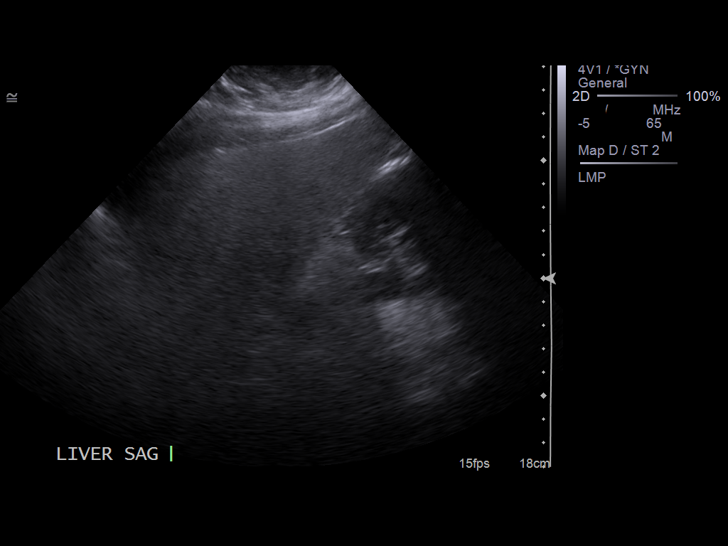
[im 23/69]
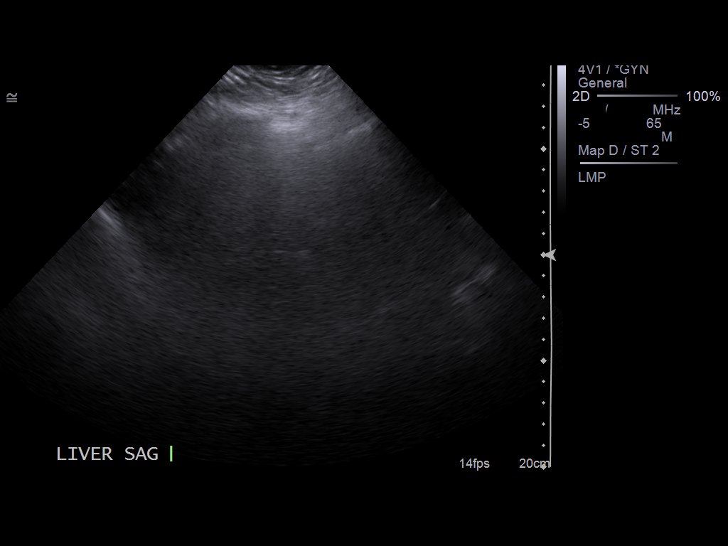
[im 29/69]
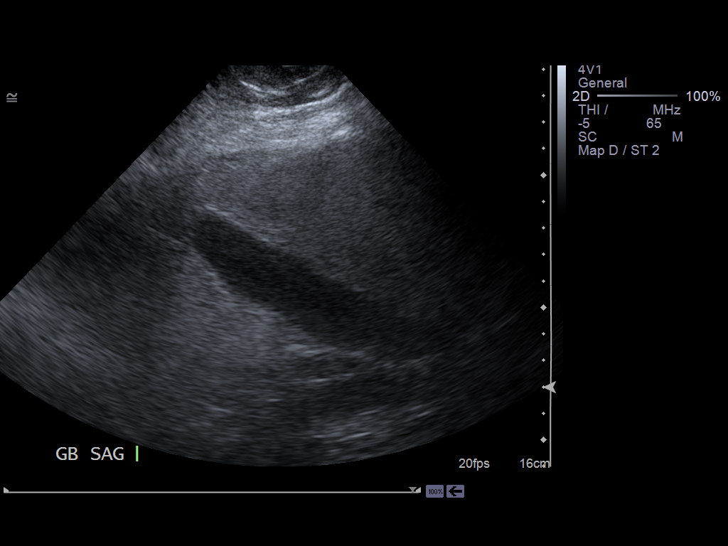
[im 35/69]
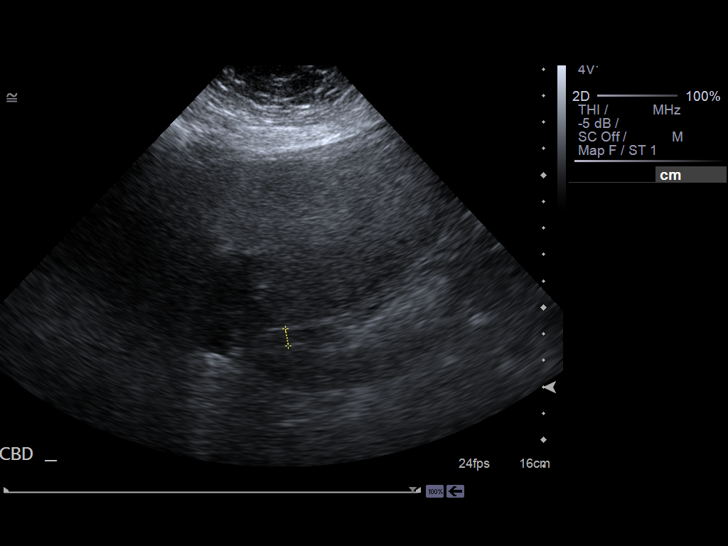
[im 40/69]
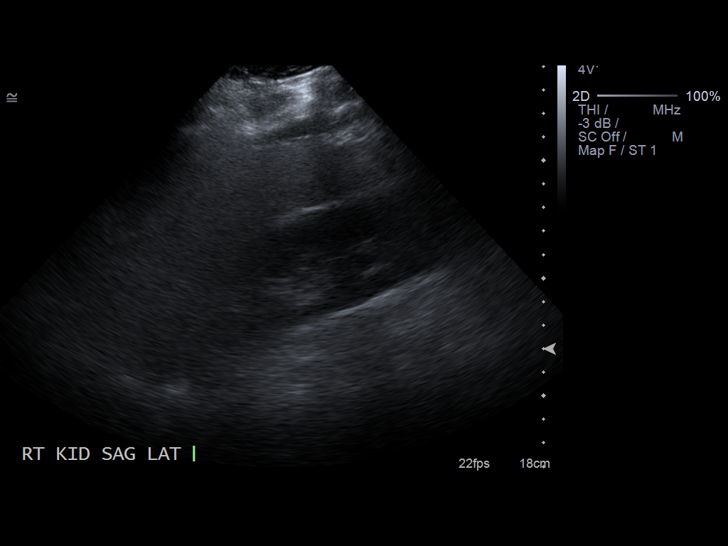
[im 46/69]
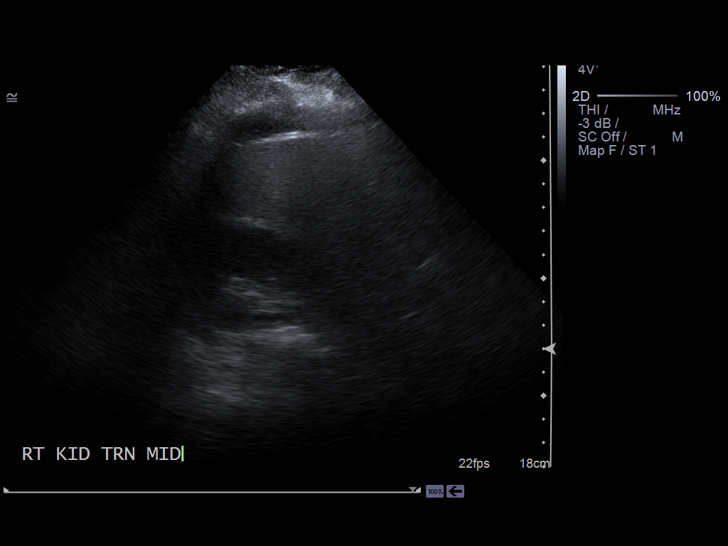
[im 52/69]
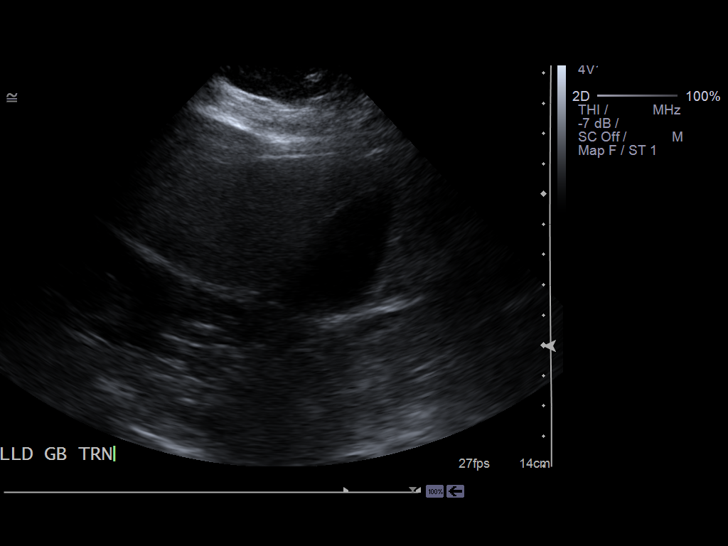
[im 57/69]
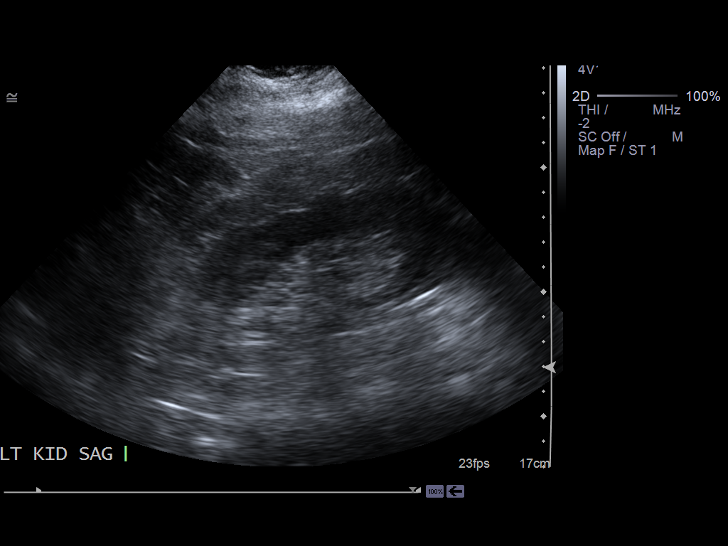
[im 63/69]
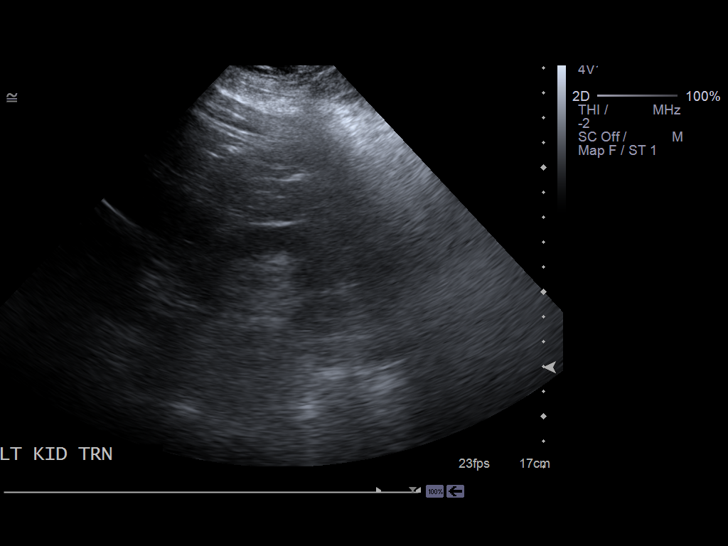
[im 69/69]
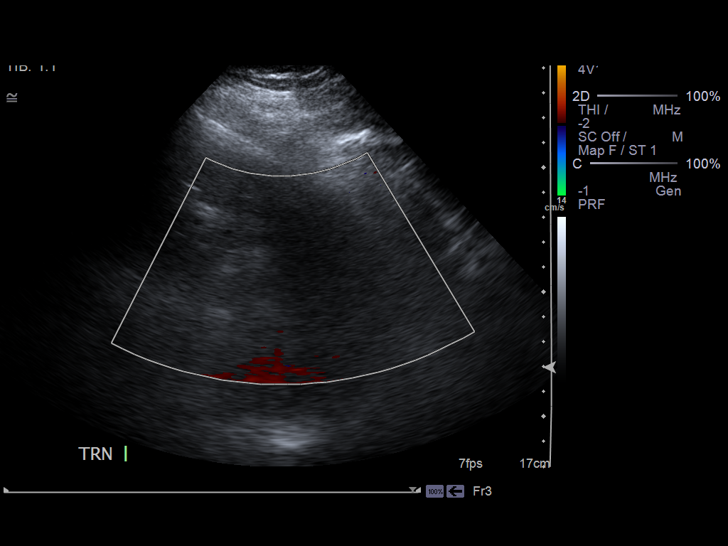

[13 of 25 positions shown; findings below may reference images not displayed]

FINDINGS: Some details are compromised by body habitus and bowel
gas.

Gallbladder: No shadowing gallstones or echogenic sludge. No
gallbladder wall thickening or pericholecystic fluid. The
gallbladder wall thickness measured 2.9 mm. No sonographic Murphy's
sign according to the ultrasound technologist.

CBD:  Borderline enlarged in caliber measuring 6.5 mm. No
choledocholithiasis is evident.  No dilatation of intrahepatic
branching bile ducts is evident.

Liver:  Normal size with increased echogenicity of parenchymal
echotexture without focal parenchymal abnormality.

IVC:  Patent throughout its visualized course in the abdomen.

Pancreas:  Although the pancreas is difficult to visualize in its
entirety, no focal pancreatic abnormality is identified.  Some
portions are obscured by bowel gas.

Spleen:  Normal size and echotexture without focal abnormality.
Length is 6 cm.

Right kidney:  No hydronephrosis.  Well-preserved cortex.  Normal
parenchymal echotexture without focal abnormalities.  Right renal
length is 11.3 cm.

Left kidney:  No hydronephrosis.  Well-preserved cortex.  Normal
parenchymal echotexture without focal abnormalities.  Left renal
length is 12.4 cm.

Aorta:  Maximum diameter is 2.1 cm.  No aneurysm is evident.
Distal portion is obscured by overlying bowel gas.

Ascites:  None.
IMPRESSION: No acute abdominal pathology was demonstrated.

Gallbladder wall upper limits of normal thickness.  No
cholelithiasis evident.

Common bile duct is borderline enlarged in caliber measuring
mm.  No dilatation of intrahepatic bile ducts is evident.  No
choledocholithiasis is evident.

There is increased echogenicity of hepatic parenchyma which is
heterogeneous with some increased attenuation.  This may be
associated with fatty infiltration of liver but is not specific.
No focal hepatic lesion is evident.

## 2012-09-02 ENCOUNTER — Encounter (HOSPITAL_COMMUNITY): Payer: Self-pay | Admitting: *Deleted

## 2012-09-02 ENCOUNTER — Inpatient Hospital Stay (HOSPITAL_COMMUNITY)
Admission: EM | Admit: 2012-09-02 | Discharge: 2012-09-05 | DRG: 872 | Disposition: A | Discharge status: Home / Self Care | Payer: Medicare Other | Attending: Internal Medicine | Admitting: Internal Medicine

## 2012-09-02 ENCOUNTER — Emergency Department (HOSPITAL_COMMUNITY): Payer: Medicare Other

## 2012-09-02 ENCOUNTER — Inpatient Hospital Stay (HOSPITAL_COMMUNITY): Payer: Medicare Other

## 2012-09-02 DIAGNOSIS — M5137 Other intervertebral disc degeneration, lumbosacral region: Secondary | ICD-10-CM | POA: Diagnosis present

## 2012-09-02 DIAGNOSIS — F4323 Adjustment disorder with mixed anxiety and depressed mood: Secondary | ICD-10-CM | POA: Diagnosis present

## 2012-09-02 DIAGNOSIS — E7800 Pure hypercholesterolemia, unspecified: Secondary | ICD-10-CM | POA: Diagnosis present

## 2012-09-02 DIAGNOSIS — E872 Acidosis, unspecified: Secondary | ICD-10-CM | POA: Diagnosis present

## 2012-09-02 DIAGNOSIS — F19939 Other psychoactive substance use, unspecified with withdrawal, unspecified: Secondary | ICD-10-CM | POA: Diagnosis present

## 2012-09-02 DIAGNOSIS — R197 Diarrhea, unspecified: Secondary | ICD-10-CM | POA: Diagnosis present

## 2012-09-02 DIAGNOSIS — M5030 Other cervical disc degeneration, unspecified cervical region: Secondary | ICD-10-CM | POA: Diagnosis present

## 2012-09-02 DIAGNOSIS — E119 Type 2 diabetes mellitus without complications: Secondary | ICD-10-CM | POA: Diagnosis present

## 2012-09-02 DIAGNOSIS — M51379 Other intervertebral disc degeneration, lumbosacral region without mention of lumbar back pain or lower extremity pain: Secondary | ICD-10-CM | POA: Diagnosis present

## 2012-09-02 DIAGNOSIS — E66813 Obesity, class 3: Secondary | ICD-10-CM | POA: Diagnosis present

## 2012-09-02 DIAGNOSIS — E860 Dehydration: Secondary | ICD-10-CM | POA: Diagnosis present

## 2012-09-02 DIAGNOSIS — Z79899 Other long term (current) drug therapy: Secondary | ICD-10-CM

## 2012-09-02 DIAGNOSIS — R000 Tachycardia, unspecified: Secondary | ICD-10-CM | POA: Diagnosis present

## 2012-09-02 DIAGNOSIS — Z6841 Body Mass Index (BMI) 40.0 and over, adult: Secondary | ICD-10-CM

## 2012-09-02 DIAGNOSIS — A419 Sepsis, unspecified organism: Principal | ICD-10-CM | POA: Diagnosis present

## 2012-09-02 DIAGNOSIS — R6510 Systemic inflammatory response syndrome (SIRS) of non-infectious origin without acute organ dysfunction: Secondary | ICD-10-CM | POA: Diagnosis present

## 2012-09-02 DIAGNOSIS — I10 Essential (primary) hypertension: Secondary | ICD-10-CM | POA: Diagnosis present

## 2012-09-02 DIAGNOSIS — F1320 Sedative, hypnotic or anxiolytic dependence, uncomplicated: Secondary | ICD-10-CM | POA: Diagnosis present

## 2012-09-02 DIAGNOSIS — A09 Infectious gastroenteritis and colitis, unspecified: Secondary | ICD-10-CM | POA: Diagnosis present

## 2012-09-02 LAB — BLOOD GAS, ARTERIAL
Bicarbonate: 22.9 mEq/L (ref 20.0–24.0)
Patient temperature: 37
pH, Arterial: 7.395 (ref 7.350–7.450)
pO2, Arterial: 80 mmHg (ref 80.0–100.0)

## 2012-09-02 LAB — MRSA PCR SCREENING: MRSA by PCR: NEGATIVE

## 2012-09-02 LAB — CBC WITH DIFFERENTIAL/PLATELET
Hemoglobin: 12.6 g/dL (ref 12.0–15.0)
Lymphs Abs: 3.2 10*3/uL (ref 0.7–4.0)
MCH: 31.1 pg (ref 26.0–34.0)
Monocytes Relative: 8 % (ref 3–12)
Neutro Abs: 4.6 10*3/uL (ref 1.7–7.7)
Neutrophils Relative %: 54 % (ref 43–77)
RBC: 4.05 MIL/uL (ref 3.87–5.11)

## 2012-09-02 LAB — URINALYSIS, ROUTINE W REFLEX MICROSCOPIC
Ketones, ur: 40 mg/dL — AB
Leukocytes, UA: NEGATIVE
Nitrite: NEGATIVE
Protein, ur: NEGATIVE mg/dL

## 2012-09-02 LAB — COMPREHENSIVE METABOLIC PANEL
Alkaline Phosphatase: 63 U/L (ref 39–117)
BUN: 18 mg/dL (ref 6–23)
CO2: 23 mEq/L (ref 19–32)
Chloride: 102 mEq/L (ref 96–112)
GFR calc Af Amer: >90 mL/min (ref >90)
Glucose, Bld: 117 mg/dL — ABNORMAL HIGH (ref 70–99)
Potassium: 4 mEq/L (ref 3.5–5.1)
Total Bilirubin: 0.4 mg/dL (ref 0.3–1.2)

## 2012-09-02 LAB — RAPID STREP SCREEN (MED CTR MEBANE ONLY): Streptococcus, Group A Screen (Direct): NEGATIVE

## 2012-09-02 LAB — PROCALCITONIN: Procalcitonin: <0.1 ng/mL

## 2012-09-02 LAB — LACTIC ACID, PLASMA: Lactic Acid, Venous: 1.7 mmol/L (ref 0.5–2.2)

## 2012-09-02 MED ORDER — SIMVASTATIN 20 MG PO TABS
20.0000 mg | ORAL_TABLET | Freq: Every day | ORAL | Status: DC
  Administered 2012-09-02 – 2012-09-04 (×3): 20 mg via ORAL
  Filled 2012-09-02 (×3): qty 1

## 2012-09-02 MED ORDER — VANCOMYCIN HCL IN DEXTROSE 1-5 GM/200ML-% IV SOLN
1000.0000 mg | Freq: Once | INTRAVENOUS | Status: DC
  Filled 2012-09-02: qty 200

## 2012-09-02 MED ORDER — IOHEXOL 300 MG/ML  SOLN
50.0000 mL | Freq: Once | INTRAMUSCULAR | Status: AC | PRN
  Administered 2012-09-02: 50 mL via ORAL

## 2012-09-02 MED ORDER — PIPERACILLIN-TAZOBACTAM 3.375 G IVPB
3.3750 g | Freq: Three times a day (TID) | INTRAVENOUS | Status: DC
  Administered 2012-09-02 – 2012-09-03 (×2): 3.375 g via INTRAVENOUS
  Filled 2012-09-02 (×2): qty 50

## 2012-09-02 MED ORDER — LISINOPRIL 10 MG PO TABS
10.0000 mg | ORAL_TABLET | Freq: Every day | ORAL | Status: DC
  Administered 2012-09-03 – 2012-09-05 (×3): 10 mg via ORAL
  Filled 2012-09-02 (×3): qty 1

## 2012-09-02 MED ORDER — VANCOMYCIN HCL IN DEXTROSE 1-5 GM/200ML-% IV SOLN
1000.0000 mg | Freq: Two times a day (BID) | INTRAVENOUS | Status: DC
  Administered 2012-09-03: 1000 mg via INTRAVENOUS
  Filled 2012-09-02: qty 200

## 2012-09-02 MED ORDER — INSULIN ASPART 100 UNIT/ML ~~LOC~~ SOLN: 0.0000 [IU] | Freq: Every day | SUBCUTANEOUS | Status: DC

## 2012-09-02 MED ORDER — PNEUMOCOCCAL VAC POLYVALENT 25 MCG/0.5ML IJ INJ
0.5000 mL | INJECTION | INTRAMUSCULAR | Status: AC
  Administered 2012-09-03: 0.5 mL via INTRAMUSCULAR
  Filled 2012-09-02: qty 0.5

## 2012-09-02 MED ORDER — SODIUM CHLORIDE 0.9 % IV SOLN
1000.0000 mL | Freq: Once | INTRAVENOUS | Status: AC
  Administered 2012-09-02: 1000 mL via INTRAVENOUS

## 2012-09-02 MED ORDER — PIPERACILLIN-TAZOBACTAM 3.375 G IVPB
INTRAVENOUS | Status: AC
  Filled 2012-09-02: qty 100

## 2012-09-02 MED ORDER — HYDROMORPHONE HCL PF 1 MG/ML IJ SOLN: 0.5000 mg | INTRAMUSCULAR | Status: DC | PRN

## 2012-09-02 MED ORDER — POTASSIUM CHLORIDE IN NACL 20-0.9 MEQ/L-% IV SOLN
INTRAVENOUS | Status: DC
  Administered 2012-09-02 – 2012-09-05 (×5): via INTRAVENOUS

## 2012-09-02 MED ORDER — ENOXAPARIN SODIUM 40 MG/0.4ML ~~LOC~~ SOLN
40.0000 mg | SUBCUTANEOUS | Status: DC
  Administered 2012-09-03 – 2012-09-05 (×3): 40 mg via SUBCUTANEOUS
  Filled 2012-09-02 (×3): qty 0.4

## 2012-09-02 MED ORDER — ACETAMINOPHEN 325 MG PO TABS: 650.0000 mg | ORAL_TABLET | ORAL | Status: DC | PRN

## 2012-09-02 MED ORDER — ALPRAZOLAM 0.5 MG PO TABS: 0.5000 mg | ORAL_TABLET | Freq: Every day | ORAL | Status: DC | PRN

## 2012-09-02 MED ORDER — TRAZODONE HCL 50 MG PO TABS
50.0000 mg | ORAL_TABLET | Freq: Every evening | ORAL | Status: DC | PRN
  Administered 2012-09-03: 50 mg via ORAL
  Filled 2012-09-02: qty 1

## 2012-09-02 MED ORDER — PAROXETINE HCL 20 MG PO TABS
40.0000 mg | ORAL_TABLET | Freq: Every day | ORAL | Status: DC
  Administered 2012-09-03 – 2012-09-05 (×3): 40 mg via ORAL
  Filled 2012-09-02 (×3): qty 2

## 2012-09-02 MED ORDER — ACETAMINOPHEN 325 MG PO TABS
650.0000 mg | ORAL_TABLET | Freq: Once | ORAL | Status: AC
  Administered 2012-09-02: 650 mg via ORAL
  Filled 2012-09-02: qty 2

## 2012-09-02 MED ORDER — PIPERACILLIN-TAZOBACTAM 3.375 G IVPB 30 MIN
3.3750 g | Freq: Once | INTRAVENOUS | Status: AC
  Administered 2012-09-02: 3.375 g via INTRAVENOUS
  Filled 2012-09-02: qty 50

## 2012-09-02 MED ORDER — HYDROMORPHONE HCL PF 1 MG/ML IJ SOLN: 1.0000 mg | INTRAMUSCULAR | Status: DC | PRN

## 2012-09-02 MED ORDER — ONDANSETRON HCL 4 MG/2ML IJ SOLN: 4.0000 mg | Freq: Three times a day (TID) | INTRAMUSCULAR | Status: DC | PRN

## 2012-09-02 MED ORDER — IOHEXOL 300 MG/ML  SOLN: 50.0000 mL | Freq: Once | INTRAMUSCULAR | Status: DC | PRN

## 2012-09-02 MED ORDER — SODIUM CHLORIDE 0.9 % IV BOLUS (SEPSIS)
1000.0000 mL | Freq: Once | INTRAVENOUS | Status: AC
  Administered 2012-09-02: 1000 mL via INTRAVENOUS

## 2012-09-02 MED ORDER — ASPIRIN EC 81 MG PO TBEC
81.0000 mg | DELAYED_RELEASE_TABLET | Freq: Every day | ORAL | Status: DC
  Administered 2012-09-02 – 2012-09-05 (×4): 81 mg via ORAL
  Filled 2012-09-02 (×4): qty 1

## 2012-09-02 MED ORDER — ONDANSETRON HCL 4 MG/2ML IJ SOLN
4.0000 mg | INTRAMUSCULAR | Status: DC | PRN
  Administered 2012-09-04: 4 mg via INTRAVENOUS
  Filled 2012-09-02: qty 2

## 2012-09-02 MED ORDER — PANTOPRAZOLE SODIUM 40 MG PO TBEC
40.0000 mg | DELAYED_RELEASE_TABLET | Freq: Every day | ORAL | Status: DC
  Administered 2012-09-03 – 2012-09-05 (×3): 40 mg via ORAL
  Filled 2012-09-02 (×3): qty 1

## 2012-09-02 MED ORDER — METOPROLOL SUCCINATE ER 25 MG PO TB24
25.0000 mg | ORAL_TABLET | Freq: Every morning | ORAL | Status: DC
  Administered 2012-09-03 – 2012-09-05 (×3): 25 mg via ORAL
  Filled 2012-09-02 (×3): qty 1

## 2012-09-02 MED ORDER — SODIUM CHLORIDE 0.9 % IV SOLN: INTRAVENOUS | Status: DC

## 2012-09-02 MED ORDER — IOHEXOL 350 MG/ML SOLN
100.0000 mL | Freq: Once | INTRAVENOUS | Status: AC | PRN
  Administered 2012-09-02: 100 mL via INTRAVENOUS

## 2012-09-02 MED ORDER — SODIUM CHLORIDE 0.9 % IV SOLN
1000.0000 mL | INTRAVENOUS | Status: DC
  Administered 2012-09-02: 1000 mL via INTRAVENOUS

## 2012-09-02 MED ORDER — VANCOMYCIN HCL IN DEXTROSE 1-5 GM/200ML-% IV SOLN
INTRAVENOUS | Status: AC
  Filled 2012-09-02: qty 200

## 2012-09-02 MED ORDER — INSULIN ASPART 100 UNIT/ML ~~LOC~~ SOLN
0.0000 [IU] | Freq: Three times a day (TID) | SUBCUTANEOUS | Status: DC
  Administered 2012-09-03: 1 [IU] via SUBCUTANEOUS
  Administered 2012-09-03: 3 [IU] via SUBCUTANEOUS
  Administered 2012-09-04 (×2): 2 [IU] via SUBCUTANEOUS
  Administered 2012-09-05: 1 [IU] via SUBCUTANEOUS

## 2012-09-02 MED ORDER — VANCOMYCIN HCL IN DEXTROSE 1-5 GM/200ML-% IV SOLN
1000.0000 mg | Freq: Once | INTRAVENOUS | Status: AC
  Administered 2012-09-02: 1000 mg via INTRAVENOUS
  Filled 2012-09-02: qty 200

## 2012-09-02 MED ORDER — VANCOMYCIN HCL IN DEXTROSE 1-5 GM/200ML-% IV SOLN
1000.0000 mg | Freq: Once | INTRAVENOUS | Status: AC
  Administered 2012-09-02: 1000 mg via INTRAVENOUS

## 2012-09-02 MED ORDER — OXYCODONE HCL 5 MG PO TABS
5.0000 mg | ORAL_TABLET | ORAL | Status: DC | PRN
  Administered 2012-09-03: 5 mg via ORAL
  Filled 2012-09-02: qty 1

## 2012-09-02 MED ORDER — LORAZEPAM 2 MG/ML IJ SOLN
1.0000 mg | Freq: Once | INTRAMUSCULAR | Status: AC
  Administered 2012-09-02: 1 mg via INTRAVENOUS
  Filled 2012-09-02: qty 1

## 2012-09-02 MED ORDER — DIPHENHYDRAMINE HCL 50 MG/ML IJ SOLN
25.0000 mg | Freq: Once | INTRAMUSCULAR | Status: AC
  Administered 2012-09-02: 25 mg via INTRAVENOUS
  Filled 2012-09-02: qty 1

## 2012-09-02 NOTE — H&P (Signed)
[facility] History and Physical  [name]  ZOX:[id number]  DOB: 1950-02-22   DOA: 09/02/2012   PCP:   Default, Provider, MD   Chief Complaint:   palpitations for 2 weeks  HPI: [name] is a 62 y.o. female.  Obese Caucasian lady has been feeling sick for the past 2 weeks, having diarrhea and palpitations, but has been self managing because she felt it was just an exacerbation of her anxiety disorder. As she worsened she went to her physician today who noted a heart rate of 140 and sent her to the emergency room to be date.   The patient reports that she is has in fact been having loose watery stools about 3-4 each day for the past 2 weeks, and had one episode of profuse vomiting today. She tried to manage her condition by going on a 3 day fast which ended 3days ago; during that 3 day fast she did not have any diarrhea immediately afterwards the diary restart.  Diarrhea has blood on the outside, and there is blood with wiping which she presumes is due to her hemorrhoids  For the past couple of days she's been having visual hallucinatory phenomena, which has made her really afraid . She recognizes that there are not real.  Other symptoms that have been present since the beginning of her illness are a melodious ringing in her right ear, and a throbbing frontal headache.  She was taken no new medications  In the emergency room she was she was found to be febrile, 101, to be having  sinus tachycardia into the 140s, which came down into the 120s after vigorous hydration.  She has had CT scans of the chest and abdomen and no focal abnormality has been found. Her lactic acid was initially a 3.0, but came down to normal after hydration; Procalcitonin  was negative for evidence of systemic bacterial infection. Her serum chemistry is surprisingly normal  Rewiew of Systems:   All systems negative except as marked bold or noted in the HPI;  Constitutional:    malaise, fever and  chills. ;  Eyes:   eye pain, redness and yellow discharge x 2 weeks . ;  ENMT:   ear pain, hoarseness, nasal congestion, sinus pressure and sore throat. ; Tinnitus;  throbbing frontal headache Cardiovascular:    chest pain, palpitations, diaphoresis, dyspnea and peripheral edema.  Respiratory:   cough, hemoptysis, wheezing and stridor. ;  Gastrointestinal:  nausea, vomiting, diarrhea, constipation, abdominal pain, melena, blood on stool and with wiping, hematemesis, jaundice and rectal bleeding. unusual weight loss..   Genitourinary:    frequency, dysuria, incontinence,flank pain and hematuria; Musculoskeletal:   back pain and neck pain.  swelling and trauma.;  Skin: .  pruritus, rash, abrasions, bruising and skin lesion.; ulcerations Neuro:   Frontal  headache, lightheadedness and neck stiffness.  ltered level of consciousness, altered mental status, extremity weakness, burning feet, involuntary movement, seizure and syncope.  Psych:    anxiety, depression, insomnia, tearfulness, panic attacks, visual hallucinations , paranoia, suicidal or homicidal ideation    Past Medical History  Diagnosis Date  . Hypertension   . Diabetes mellitus   . Hypercholesteremia   . Anxiety   . Depression   . DDD (degenerative disc disease), cervical   . DDD (degenerative disc disease), lumbar     Past Surgical History  Procedure Laterality Date  . Knee surgery    . Ankle surgery    . Dilation and curettage of uterus  Medications:  HOME MEDS: Prior to Admission medications   Medication Sig Start Date End Date Taking? Authorizing Provider  ALPRAZolam Prudy Feeler) 0.5 MG tablet Take 0.5 mg by mouth daily as needed for sleep or anxiety.   Yes Historical Provider, MD  aspirin EC 81 MG tablet Take 324 mg by mouth once. *Given by [facility]  Prior to admission*   Yes Historical Provider, MD  glimepiride (AMARYL) 2 MG tablet Take 2 mg by mouth 2 (two) times daily.   Yes Historical Provider, MD   isosorbide mononitrate (IMDUR) 30 MG 24 hr tablet Take 30 mg by mouth every morning.   Yes Historical Provider, MD  lisinopril (PRINIVIL,ZESTRIL) 10 MG tablet Take 10 mg by mouth daily.   Yes Historical Provider, MD  metFORMIN (GLUCOPHAGE) 1000 MG tablet Take 1,000 mg by mouth 2 (two) times daily with a meal.   Yes Historical Provider, MD  metoprolol succinate (TOPROL-XL) 25 MG 24 hr tablet Take 25 mg by mouth every morning.    Yes Historical Provider, MD  omeprazole (PRILOSEC) 20 MG capsule Take 20 mg by mouth every morning.   Yes Historical Provider, MD  PARoxetine (PAXIL) 40 MG tablet Take 40 mg by mouth 2 (two) times daily.   Yes Historical Provider, MD  simvastatin (ZOCOR) 20 MG tablet Take 20 mg by mouth at bedtime.   Yes Historical Provider, MD  temazepam (RESTORIL) 15 MG capsule Take 15 mg by mouth at bedtime as needed for sleep.   Yes Historical Provider, MD     Allergies:  No Known Allergies  Social History:   reports that she has never smoked. She does not have any smokeless tobacco history on file. She reports that she does not drink alcohol or use illicit drugs.  Family History: No family history on file.   Physical Exam: Filed Vitals:   09/02/12 1800 09/02/12 1823 09/02/12 1916 09/02/12 2040  BP: 109/52  117/56   Pulse: 120  119   Temp:  98.1 F (36.7 C)  98.2 F (36.8 C)  TempSrc:  Oral  Oral  Resp: 21  18   Height:    5' (1.524 m)  Weight:    96.5 kg (212 lb 11.9 oz)  SpO2: 99%  100%    Blood pressure 117/56, pulse 119, temperature 98.2 F (36.8 C), temperature source Oral, resp. rate 18, height 5' (1.524 m), weight 96.5 kg (212 lb 11.9 oz), SpO2 100.00%. Body mass index is 41.55 kg/(m^2).   GEN:  Pleasant obese Caucasian lady lying bed in no acute distress; cooperative with exam PSYCH:  alert and oriented x4;  a little anxious; affect is appropriate. HEENT: Mucous membranes pink dry and anicteric; PERRLA; EOM intact; no cervical lymphadenopathy nor  thyromegaly or carotid bruit; no JVD; full neck Breasts:: Not examined CHEST WALL: No tenderness CHEST: Normal respiration, clear to auscultation bilaterally HEART: Tachycardic regular rhythm; no murmurs rubs or gallops BACK: Moderate kyphosis no scoliosis; no CVA tenderness ABDOMEN: Obese, soft non-tender; no masses, no organomegaly, moderate pannus; no intertriginous candida. Rectal Exam: Not done EXTREMITIES: ; age-appropriate arthropathy of the hands and knees; no edema; no ulcerations. Genitalia: not examined PULSES: 2+ and symmetric SKIN: Normal hydration no rash or ulceration CNS: Cranial nerves 2-12 grossly intact no focal lateralizing neurologic deficit   Labs on Admission:  Basic Metabolic Panel:  Recent Labs Lab 09/02/12 1552  NA 139  K 4.0  CL 102  CO2 23  GLUCOSE 117*  BUN 18  CREATININE 0.70  CALCIUM 9.9   Liver Function Tests:  Recent Labs Lab 09/02/12 1552  AST 27  ALT 20  ALKPHOS 63  BILITOT 0.4  PROT 8.0  ALBUMIN 3.9    Recent Labs Lab 09/02/12 1552  LIPASE 39   No results found for this basename: AMMONIA,  in the last 168 hours CBC:  Recent Labs Lab 09/02/12 1552  WBC 8.6  NEUTROABS 4.6  HGB 12.6  HCT 37.6  MCV 92.8  PLT 224   Cardiac Enzymes:  Recent Labs Lab 09/02/12 1552  TROPONINI <0.30   BNP: No components found with this basename: POCBNP,  D-dimer: No components found with this basename: D-DIMER,  CBG: No results found for this basename: GLUCAP,  in the last 168 hours  Radiological Exams on Admission: Ct Angio Chest Pe W/cm &/or Wo Cm  09/02/2012   *RADIOLOGY REPORT*  Clinical Data:  Chest pain with tachycardia and fever.  History of hypertension and diabetes.  Question pulmonary embolism.  CT ANGIOGRAPHY CHEST CT ABDOMEN AND PELVIS WITH CONTRAST  Technique:  Multidetector CT imaging of the chest was performed using the standard protocol during bolus administration of intravenous contrast.  Multiplanar CT image  reconstructions including MIPs were obtained to evaluate the vascular anatomy. Multidetector CT imaging of the abdomen and pelvis was performed using the standard protocol during bolus administration of intravenous contrast.  Contrast: 50mL OMNIPAQUE IOHEXOL 300 MG/ML  SOLN, OMNIPAQUE IOHEXOL 350 MG/ML SOLN  Comparison:  Chest CT 10/04/2009.  CTA CHEST  Findings:  The pulmonary arteries are well opacified with contrast. There is no evidence of acute pulmonary embolism.  Mild atherosclerosis of the aorta, great vessels and coronary arteries is noted.  There are no enlarged mediastinal or hilar lymph nodes.  There is no significant pleural or pericardial effusion.  Mild linear scarring is present in the left lower lobe.  The ground- glass opacities noted previously have resolved.  There is no airspace disease or endobronchial lesion.   Review of the MIP images confirms the above findings.  IMPRESSION: No evidence of acute pulmonary embolism or other acute chest process.  CT ABDOMEN AND PELVIS  Findings: There is a sub centimeter low density lesion in the right hepatic lobe on image 20.  The liver, gallbladder, biliary system and pancreas otherwise appear normal.  The spleen and adrenal glands appear normal.  There are small low-density renal lesions bilaterally, too small to characterize.  No enhancing renal mass, hydronephrosis or urinary tract calculus is seen.  The stomach, small bowel, appendix and colon demonstrate no acute findings.  There is lobularity of the uterus consistent with small fibroids.  There is no adnexal mass.  The bladder appears normal. There are no enlarged abdominal pelvic lymph nodes.  Mild aorto iliac atherosclerosis is present.  There is facet disease in the lower lumbar spine with near anatomic alignment.  No acute osseous findings are seen.  Review of the MIP images confirms the above findings.  IMPRESSION:  1.  No acute abdominal pelvic findings demonstrated. 2.  Small low density  hepatic and renal lesions, too small to characterize. 3.  Uterine fibroids.   Original Report Authenticated By: Carey Bullocks, M.D.   Ct Abdomen Pelvis W Contrast  09/02/2012   *RADIOLOGY REPORT*  Clinical Data:  Chest pain with tachycardia and fever.  History of hypertension and diabetes.  Question pulmonary embolism.  CT ANGIOGRAPHY CHEST CT ABDOMEN AND PELVIS WITH CONTRAST  Technique:  Multidetector CT imaging of the chest was performed  using the standard protocol during bolus administration of intravenous contrast.  Multiplanar CT image reconstructions including MIPs were obtained to evaluate the vascular anatomy. Multidetector CT imaging of the abdomen and pelvis was performed using the standard protocol during bolus administration of intravenous contrast.  Contrast: 50mL OMNIPAQUE IOHEXOL 300 MG/ML  SOLN, OMNIPAQUE IOHEXOL 350 MG/ML SOLN  Comparison:  Chest CT 10/04/2009.  CTA CHEST  Findings:  The pulmonary arteries are well opacified with contrast. There is no evidence of acute pulmonary embolism.  Mild atherosclerosis of the aorta, great vessels and coronary arteries is noted.  There are no enlarged mediastinal or hilar lymph nodes.  There is no significant pleural or pericardial effusion.  Mild linear scarring is present in the left lower lobe.  The ground- glass opacities noted previously have resolved.  There is no airspace disease or endobronchial lesion.   Review of the MIP images confirms the above findings.  IMPRESSION: No evidence of acute pulmonary embolism or other acute chest process.  CT ABDOMEN AND PELVIS  Findings: There is a sub centimeter low density lesion in the right hepatic lobe on image 20.  The liver, gallbladder, biliary system and pancreas otherwise appear normal.  The spleen and adrenal glands appear normal.  There are small low-density renal lesions bilaterally, too small to characterize.  No enhancing renal mass, hydronephrosis or urinary tract calculus is seen.  The  stomach, small bowel, appendix and colon demonstrate no acute findings.  There is lobularity of the uterus consistent with small fibroids.  There is no adnexal mass.  The bladder appears normal. There are no enlarged abdominal pelvic lymph nodes.  Mild aorto iliac atherosclerosis is present.  There is facet disease in the lower lumbar spine with near anatomic alignment.  No acute osseous findings are seen.  Review of the MIP images confirms the above findings.  IMPRESSION:  1.  No acute abdominal pelvic findings demonstrated. 2.  Small low density hepatic and renal lesions, too small to characterize. 3.  Uterine fibroids.   Original Report Authenticated By: Carey Bullocks, M.D.   Dg Chest Port 1 View  09/02/2012   *RADIOLOGY REPORT*  Clinical Data: Anxiety with vomiting.  PORTABLE CHEST - 1 VIEW  Comparison: 04/25/2011 radiographs.  CT 10/04/2009.  Findings: 1603 hours. The heart size and mediastinal contours are normal. The lungs are clear. There is no pleural effusion or pneumothorax. No acute osseous findings are identified.  Paraspinal osteophytes are noted in the thoracic spine.  IMPRESSION: No active cardiopulmonary process.   Original Report Authenticated By: Carey Bullocks, M.D.    EKG: Independently reviewed. Sinus tachycardia  Assessment/Plan   Active Problems:   Tachycardia   SIRS (systemic inflammatory response syndrome)   Diarrhea   Obesity, Class III, BMI 40-49.9 (morbid obesity)   Adjustment disorder with mixed anxiety and depressed mood   Diabetes type 2, controlled   HTN (hypertension)   PLAN: Continue vigorous hydration with potassium Check stool for C. difficile and other GI pathogens Continue antibiotics pending results of cultures Advance diet as tolerated  Reduced dose of Paxil in case this is a Paxil the setting of dehydration Sliding scale insulin for diabetes; hold metformin because of lactic acidosis and recent exposure to contrast; hold Amaryl until diet is  normalized   Other plans as per orders.  Code Status: Full code Family Communication: Plans discuss with patient Disposition Plan: Depending on response to initial  Critical care time: 60 minutes.   Adis Sturgill [employer] [facility]  [phone] 09/02/2012, 8:52 PM

## 2012-09-02 NOTE — ED Provider Notes (Signed)
CSN: [id number]     Arrival date & time 09/02/12  1527 History    This chart was scribed for Tonya Octave, MD, by Yevette Edwards, ED Scribe and Bennett Scrape, ED scribe. This patient was seen in room APA16A/[facility]16A and the patient's care was started at 3:35 PM.   First MD Initiated Contact with Patient 09/02/12 1534     Chief Complaint  Patient presents with  . Anxiety    The history is provided by the patient. No language interpreter was used.   HPI Comments: [name] is a 62 y.o. female who presents to the Emergency Department complaining of anxiety which has been occurring for two weeks. Today the pt began to experience a constant tightness to her chest. In addition to anxiety, the pt has experienced a waxing and waning headache for a week; she has experienced associated photophobia. The pt reports she has also experienced a headache, a fever, a cough, abdominal pain she describes as "cramping," nausea, and emesis.The patient reports that she is has in fact been having loose watery stools about 3-4 each day for the past 2 weeks, and had one episode of profuse vomiting today.  She has also be experiencing hallucinations and confusion. She denies suicide ideations,  homicidal hallucinations, dysuria, and hematuria. She had been fasting for three days. She reports, with her medication, her blood pressure is usually 125/80; at bedside it was 98/69. She denies any h/o cardiac issues.The pt has a h/o HTN and BM. She denies drinking alcohol and she denies using illicit drugs.    PCP is with [facility]  Past Medical History  Diagnosis Date  . Hypertension   . Diabetes mellitus   . Hypercholesteremia   . Anxiety   . Depression   . DDD (degenerative disc disease), cervical   . DDD (degenerative disc disease), lumbar    Past Surgical History  Procedure Laterality Date  . Knee surgery    . Ankle surgery    . Dilation and curettage of uterus     No family history on  file. History  Substance Use Topics  . Smoking status: Never Smoker   . Smokeless tobacco: Not on file  . Alcohol Use: No   No OB history provided.   Review of Systems A complete 10 system review of systems was obtained, and all systems were negative except where indicated in the HPI and PE.   Allergies  Review of patient's allergies indicates no known allergies.  Home Medications   No current outpatient prescriptions on file. Triage Schedule: BP 98/69  Resp 20  SpO2 97%  Physical Exam  Nursing note and vitals reviewed. Constitutional: She appears well-developed and well-nourished. No distress.  Tremulous, anxious.    HENT:  Head: Normocephalic and atraumatic.  Mouth/Throat: Oropharynx is clear and moist. No oropharyngeal exudate.  Eyes: Conjunctivae and EOM are normal. Pupils are equal, round, and reactive to light. Right eye exhibits no discharge. Left eye exhibits no discharge. No scleral icterus.  Neck: Normal range of motion. Neck supple. No JVD present. No thyromegaly present.  No meningismus.   Cardiovascular: Regular rhythm, normal heart sounds and intact distal pulses.  Exam reveals no gallop and no friction rub.   No murmur heard. Tachycardic.  Pulmonary/Chest: Effort normal and breath sounds normal. No respiratory distress. She has no wheezes. She has no rales.  Abdominal: Soft. Bowel sounds are normal. She exhibits no distension and no mass. There is tenderness.  Diffuse tenderness without guarding or  rebound  Musculoskeletal: Normal range of motion. She exhibits no edema and no tenderness.  Lymphadenopathy:    She has no cervical adenopathy.  Neurological: She is alert. No cranial nerve deficit. She exhibits normal muscle tone. Coordination normal.  Skin: Skin is warm and dry. No rash noted. No erythema.  Psychiatric: She has a normal mood and affect. Her behavior is normal.    ED Course   DIAGNOSTIC STUDIES: Oxygen Saturation is 97% on room air, normal  by my interpretation.    COORDINATION OF CARE:  3:42 PM- Discussed treatment plan with patient, and the patient agreed to the plan.  5:25 PM-Pt rechecked and reports that the shaking has resolved but she continues to have abdominal pain. She states that she is experiencing mild dizziness with sitting up currently but denies SOB or trouble breathing. Informed pt of negative lab and CXR results. HR has improved (112) and BP has decreased (101/51). Discussed admission with pt and pt is agreeable.-CMT 6:48 PM-Informed pt of negative chest and abdomen CT. Pt is still having fevers and is tachycardic (HR 124). Pt reports mostly abdominal pain. Stil agreeable to admission-CMT 7:19PM-Consult complete with Dr. Orvan Falconer, hospitalist. Patient case explained and discussed. Dr. Orvan Falconer agrees to admit patient for further evaluation and treatment to step down, team 2. Call ended at 7:20 PM.-Consult transcribed for Dr. Manus Gunning, MD by Bennett Scrape, ED scribe on 09/02/12 at 7:19 PM.  Procedures (including critical care time)  Labs Reviewed  COMPREHENSIVE METABOLIC PANEL - Abnormal; Notable for the following:    Glucose, Bld 117 (*)    All other components within normal limits  URINALYSIS, ROUTINE W REFLEX MICROSCOPIC - Abnormal; Notable for the following:    Specific Gravity, Urine >1.030 (*)    Bilirubin Urine SMALL (*)    Ketones, ur 40 (*)    All other components within normal limits  LACTIC ACID, PLASMA - Abnormal; Notable for the following:    Lactic Acid, Venous 3.0 (*)    All other components within normal limits  CBC - Abnormal; Notable for the following:    RBC 3.54 (*)    Hemoglobin 10.5 (*)    HCT 33.6 (*)    All other components within normal limits  GLUCOSE, CAPILLARY - Abnormal; Notable for the following:    Glucose-Capillary 128 (*)    All other components within normal limits  RAPID STREP SCREEN  MRSA PCR SCREENING  CULTURE, BLOOD (ROUTINE X 2)  CULTURE, BLOOD (ROUTINE X 2)   URINE CULTURE  CULTURE, GROUP A STREP  CLOSTRIDIUM DIFFICILE BY PCR  CBC WITH DIFFERENTIAL  BLOOD GAS, ARTERIAL  LIPASE, BLOOD  PROCALCITONIN  PROTIME-INR  TROPONIN I  BASIC METABOLIC PANEL  LACTIC ACID, PLASMA  MAGNESIUM  GLUCOSE, CAPILLARY  TSH  HEMOGLOBIN A1C  GI PATHOGEN PANEL BY PCR, STOOL   Ct Angio Chest Pe W/cm &/or Wo Cm  09/02/2012   *RADIOLOGY REPORT*  Clinical Data:  Chest pain with tachycardia and fever.  History of hypertension and diabetes.  Question pulmonary embolism.  CT ANGIOGRAPHY CHEST CT ABDOMEN AND PELVIS WITH CONTRAST  Technique:  Multidetector CT imaging of the chest was performed using the standard protocol during bolus administration of intravenous contrast.  Multiplanar CT image reconstructions including MIPs were obtained to evaluate the vascular anatomy. Multidetector CT imaging of the abdomen and pelvis was performed using the standard protocol during bolus administration of intravenous contrast.  Contrast: 50mL OMNIPAQUE IOHEXOL 300 MG/ML  SOLN, OMNIPAQUE IOHEXOL 350 MG/ML SOLN  Comparison:  Chest CT 10/04/2009.  CTA CHEST  Findings:  The pulmonary arteries are well opacified with contrast. There is no evidence of acute pulmonary embolism.  Mild atherosclerosis of the aorta, great vessels and coronary arteries is noted.  There are no enlarged mediastinal or hilar lymph nodes.  There is no significant pleural or pericardial effusion.  Mild linear scarring is present in the left lower lobe.  The ground- glass opacities noted previously have resolved.  There is no airspace disease or endobronchial lesion.   Review of the MIP images confirms the above findings.  IMPRESSION: No evidence of acute pulmonary embolism or other acute chest process.  CT ABDOMEN AND PELVIS  Findings: There is a sub centimeter low density lesion in the right hepatic lobe on image 20.  The liver, gallbladder, biliary system and pancreas otherwise appear normal.  The spleen and adrenal  glands appear normal.  There are small low-density renal lesions bilaterally, too small to characterize.  No enhancing renal mass, hydronephrosis or urinary tract calculus is seen.  The stomach, small bowel, appendix and colon demonstrate no acute findings.  There is lobularity of the uterus consistent with small fibroids.  There is no adnexal mass.  The bladder appears normal. There are no enlarged abdominal pelvic lymph nodes.  Mild aorto iliac atherosclerosis is present.  There is facet disease in the lower lumbar spine with near anatomic alignment.  No acute osseous findings are seen.  Review of the MIP images confirms the above findings.  IMPRESSION:  1.  No acute abdominal pelvic findings demonstrated. 2.  Small low density hepatic and renal lesions, too small to characterize. 3.  Uterine fibroids.   Original Report Authenticated By: Carey Bullocks, M.D.   Ct Abdomen Pelvis W Contrast  09/02/2012   *RADIOLOGY REPORT*  Clinical Data:  Chest pain with tachycardia and fever.  History of hypertension and diabetes.  Question pulmonary embolism.  CT ANGIOGRAPHY CHEST CT ABDOMEN AND PELVIS WITH CONTRAST  Technique:  Multidetector CT imaging of the chest was performed using the standard protocol during bolus administration of intravenous contrast.  Multiplanar CT image reconstructions including MIPs were obtained to evaluate the vascular anatomy. Multidetector CT imaging of the abdomen and pelvis was performed using the standard protocol during bolus administration of intravenous contrast.  Contrast: 50mL OMNIPAQUE IOHEXOL 300 MG/ML  SOLN, OMNIPAQUE IOHEXOL 350 MG/ML SOLN  Comparison:  Chest CT 10/04/2009.  CTA CHEST  Findings:  The pulmonary arteries are well opacified with contrast. There is no evidence of acute pulmonary embolism.  Mild atherosclerosis of the aorta, great vessels and coronary arteries is noted.  There are no enlarged mediastinal or hilar lymph nodes.  There is no significant pleural or  pericardial effusion.  Mild linear scarring is present in the left lower lobe.  The ground- glass opacities noted previously have resolved.  There is no airspace disease or endobronchial lesion.   Review of the MIP images confirms the above findings.  IMPRESSION: No evidence of acute pulmonary embolism or other acute chest process.  CT ABDOMEN AND PELVIS  Findings: There is a sub centimeter low density lesion in the right hepatic lobe on image 20.  The liver, gallbladder, biliary system and pancreas otherwise appear normal.  The spleen and adrenal glands appear normal.  There are small low-density renal lesions bilaterally, too small to characterize.  No enhancing renal mass, hydronephrosis or urinary tract calculus is seen.  The stomach, small bowel, appendix and colon demonstrate no acute  findings.  There is lobularity of the uterus consistent with small fibroids.  There is no adnexal mass.  The bladder appears normal. There are no enlarged abdominal pelvic lymph nodes.  Mild aorto iliac atherosclerosis is present.  There is facet disease in the lower lumbar spine with near anatomic alignment.  No acute osseous findings are seen.  Review of the MIP images confirms the above findings.  IMPRESSION:  1.  No acute abdominal pelvic findings demonstrated. 2.  Small low density hepatic and renal lesions, too small to characterize. 3.  Uterine fibroids.   Original Report Authenticated By: Carey Bullocks, M.D.   Dg Chest Port 1 View  09/02/2012   *RADIOLOGY REPORT*  Clinical Data: Anxiety with vomiting.  PORTABLE CHEST - 1 VIEW  Comparison: 04/25/2011 radiographs.  CT 10/04/2009.  Findings: 1603 hours. The heart size and mediastinal contours are normal. The lungs are clear. There is no pleural effusion or pneumothorax. No acute osseous findings are identified.  Paraspinal osteophytes are noted in the thoracic spine.  IMPRESSION: No active cardiopulmonary process.   Original Report Authenticated By: Carey Bullocks, M.D.    1. Sepsis   2. Tachycardia   3. Diarrhea   4. Diabetes type 2, controlled   5. HTN (hypertension)     MDM  2 days of fever, tachycardia, body aches, anxiety, tremors.  Concern for sepsis.  Labs, cultures, antibiotics.  Tachycardia with hypotension and fever.  Patient given IVF and antibiotics. No obvious source.  UA negative. CXR negative for infection. Lactate 3.  Patient c/o abdominal pain.  Will CT.  HR improved to 120s.  BP 101 systolic.  CT abdomen negative for acute pathology.  No PE.  Lactate improved to 1.7 on recheck. Remains tachycardic to 120s. BP >100 systolic. SIRs with possible diarrheal illness. Continue antibiotics.  No stooling or vomiting in the ED.   Date: 09/02/2012  Rate: 138  Rhythm: sinus tachycardia  QRS Axis: normal  Intervals: normal  ST/T Wave abnormalities: normal  Conduction Disutrbances:none  Narrative Interpretation: rate faster  Old EKG Reviewed: changes noted  CRITICAL CARE Performed by: [name] Total critical care time: 45 Critical care time was exclusive of separately billable procedures and treating other patients. Critical care was necessary to treat or prevent imminent or life-threatening deterioration. Critical care was time spent personally by me on the following activities: development of treatment plan with patient and/or surrogate as well as nursing, discussions with consultants, evaluation of patient's response to treatment, examination of patient, obtaining history from patient or surrogate, ordering and performing treatments and interventions, ordering and review of laboratory studies, ordering and review of radiographic studies, pulse oximetry and re-evaluation of patient's condition.  I personally performed the services described in this documentation, which was scribed in my presence. The recorded information has been reviewed and is accurate.    Tonya Octave, MD 09/03/12 [phone]

## 2012-09-02 NOTE — ED Notes (Signed)
Sent here from [facility] for tachycardia and anxiety.  Went in today for fever, hallucinations, and tremors.  States seen spots on walls and colorful lights yesterday, denies today.   Reports has been under a lot of stress lately.  Has been fasting x 3 days.  Pt alert and oriented x 4 at this time.  C/o chest tightness.

## 2012-09-02 NOTE — ED Notes (Signed)
Patient scratching L arm.  Raised whelps noted over inner aspect at antecubital area.  States this is where she was given IV contrast.  Notified Dr. Manus Gunning.  Order given for Benadryl IV.

## 2012-09-02 NOTE — Progress Notes (Signed)
ANTIBIOTIC CONSULT NOTE - INITIAL  Pharmacy Consult for Vancomycin and Zosyn Indication: suspected sepsis  No Known Allergies  Patient Measurements: Height: 5' (152.4 cm) Weight: 206 lb (93.441 kg) IBW/kg (Calculated) : 45.5  Vital Signs: Temp: 101 F (38.3 C) (07/29 1540) Temp src: Oral (07/29 1540) BP: 101/51 mmHg (07/29 1700) Pulse Rate: 122 (07/29 1700) Intake/Output from previous day:   Intake/Output from this shift:    Labs:  Recent Labs  09/02/12 1552  WBC 8.6  HGB 12.6  PLT 224  CREATININE 0.70   Estimated Creatinine Clearance: 75.4 ml/min (by C-G formula based on Cr of 0.7). No results found for this basename: VANCOTROUGH, Leodis Binet, VANCORANDOM, GENTTROUGH, GENTPEAK, GENTRANDOM, TOBRATROUGH, TOBRAPEAK, TOBRARND, AMIKACINPEAK, AMIKACINTROU, AMIKACIN,  in the last 72 hours   Microbiology: Recent Results (from the past 720 hour(s))  RAPID STREP SCREEN     Status: None   Collection Time    09/02/12  4:14 PM      Result Value Range Status   Streptococcus, Group A Screen (Direct) NEGATIVE  NEGATIVE Final   Comment: (NOTE)     A Rapid Antigen test may result negative if the antigen level in the     sample is below the detection level of this test. The FDA has not     cleared this test as a stand-alone test therefore the rapid antigen     negative result has reflexed to a Group A Strep culture.    Medical History: Past Medical History  Diagnosis Date  . Hypertension   . Diabetes mellitus   . Hypercholesteremia   . Anxiety   . Depression   . DDD (degenerative disc disease), cervical   . DDD (degenerative disc disease), lumbar     Medications:  Scheduled:   Assessment: 62yo female admitted with suspected sepsis.  Pt is obese with good renal fxn.  Estimated Creatinine Clearance: 75.4 ml/min (by C-G formula based on Cr of 0.7).  Goal of Therapy:  Vancomycin trough level 15-20 mcg/ml  Plan:  Vancomycin 2000mg  IV now x 1 (loading dose) Vancomycin  1000mg  IV q12hrs starting tomorrow Check trough at steady state Zosyn 3.375gm IV q8hrs Monitor labs, renal fxn, and cultures  Valrie Hart A 09/02/2012,5:19 PM

## 2012-09-02 NOTE — ED Notes (Signed)
Patient also states that she has a throbbing feeling in her head again at its like a 5 on the pain scale.

## 2012-09-03 DIAGNOSIS — R6510 Systemic inflammatory response syndrome (SIRS) of non-infectious origin without acute organ dysfunction: Secondary | ICD-10-CM

## 2012-09-03 DIAGNOSIS — F4323 Adjustment disorder with mixed anxiety and depressed mood: Secondary | ICD-10-CM

## 2012-09-03 DIAGNOSIS — R000 Tachycardia, unspecified: Secondary | ICD-10-CM

## 2012-09-03 LAB — CBC
MCH: 29.7 pg (ref 26.0–34.0)
MCV: 94.9 fL (ref 78.0–100.0)
Platelets: 177 10*3/uL (ref 150–400)
RDW: 14.3 % (ref 11.5–15.5)
WBC: 5.5 10*3/uL (ref 4.0–10.5)

## 2012-09-03 LAB — GLUCOSE, CAPILLARY: Glucose-Capillary: 143 mg/dL — ABNORMAL HIGH (ref 70–99)

## 2012-09-03 LAB — MAGNESIUM: Magnesium: 1.6 mg/dL (ref 1.5–2.5)

## 2012-09-03 LAB — BASIC METABOLIC PANEL
CO2: 26 mEq/L (ref 19–32)
Chloride: 108 mEq/L (ref 96–112)
GFR calc Af Amer: >90 mL/min (ref >90)
Sodium: 142 mEq/L (ref 135–145)

## 2012-09-03 LAB — URINE CULTURE: Colony Count: NO GROWTH

## 2012-09-03 LAB — TSH: TSH: 0.862 u[IU]/mL (ref 0.350–4.500)

## 2012-09-03 MED ORDER — ALPRAZOLAM 0.5 MG PO TABS
0.5000 mg | ORAL_TABLET | Freq: Two times a day (BID) | ORAL | Status: DC
  Administered 2012-09-03 – 2012-09-05 (×5): 0.5 mg via ORAL
  Filled 2012-09-03 (×5): qty 1

## 2012-09-03 MED ORDER — METRONIDAZOLE IN NACL 5-0.79 MG/ML-% IV SOLN
500.0000 mg | Freq: Three times a day (TID) | INTRAVENOUS | Status: DC
  Administered 2012-09-03 – 2012-09-05 (×6): 500 mg via INTRAVENOUS
  Filled 2012-09-03 (×7): qty 100

## 2012-09-03 MED ORDER — RISAQUAD PO CAPS
2.0000 | ORAL_CAPSULE | Freq: Every day | ORAL | Status: DC
  Administered 2012-09-03 – 2012-09-05 (×3): 2 via ORAL
  Filled 2012-09-03 (×5): qty 2

## 2012-09-03 MED ORDER — CIPROFLOXACIN IN D5W 400 MG/200ML IV SOLN
400.0000 mg | Freq: Two times a day (BID) | INTRAVENOUS | Status: DC
  Administered 2012-09-03 – 2012-09-05 (×5): 400 mg via INTRAVENOUS
  Filled 2012-09-03 (×5): qty 200

## 2012-09-03 NOTE — Care Management Note (Signed)
    Page 1 of 1   09/05/2012     9:10:15 AM   CARE MANAGEMENT NOTE 09/05/2012  Patient:  [name], [name]   Account Number:  [id number]  Date Initiated:  09/03/2012  Documentation initiated by:  Sharrie Rothman  Subjective/Objective Assessment:   Pt admitted from home with possible sepsis. Pt lives with 2 granddaughters who are staying with other family while pt is in hospital. Pt stated that she is independent with ADL's.     Action/Plan:   No CM needs noted.   Anticipated DC Date:  09/03/2012   Anticipated DC Plan:  HOME/SELF CARE      DC Planning Services  CM consult      Choice offered to / List presented to:             Status of service:  Completed, signed off Medicare Important Message given?  YES (If response is "NO", the following Medicare IM given date fields will be blank) Date Medicare IM given:  09/05/2012 Date Additional Medicare IM given:    Discharge Disposition:  HOME/SELF CARE  Per UR Regulation:    If discussed at Long Length of Stay Meetings, dates discussed:    Comments:  09/05/12 0910 Arlyss Queen, RN BSN CM Pt discharged home today. PCP followup made with pts PCP at [facility]. No CM needs noted.  09/03/12 1433 Arlyss Queen, RN BSN CM

## 2012-09-03 NOTE — Progress Notes (Signed)
UR Chart Review Completed  

## 2012-09-03 NOTE — Progress Notes (Signed)
[facility] PROGRESS NOTE  NATE PERRI ZOX:[id number] DOB: 1950-10-24 DOA: 09/02/2012 PCP: Default, Provider, MD  HPI: [name] is a 62 y.o. Female Obese Caucasian lady has been feeling sick for the past 2 weeks, having diarrhea and palpitations, but has been self managing because she felt it was just an exacerbation of her anxiety disorder. As she worsened she went to her PCP who noted a heart rate of 140 and sent her to the emergency room. The patient reports that she is has in fact been having loose watery stools about 3-4 each day for the past 2 weeks, and had one episode of profuse vomiting today.   Assessment/Plan:  Diarrhea - febrile on admission, elevated lactic acid, probably infectious.  - C diff, GI pathogen pending. - switch to Ciprofloxacin/Metronidazole - UA without evidence on infection, CXR without pneumonia - TSH pending  Anxiety - seen by psychiatry as an outpatient. She appears to be on 2 benzodiazepines at home. She states that she takes it very infrequently, last doses were 1 week ago - she appears tremulous today; endorses hallucinations at home. This appears somewhat like benzodiazepine withdrawal. Will start scheduled low dose Xanax.  DM HTN Obesity  Code Status: Full Family Communication: none  Disposition Plan: inpatient  Consultants:  none  Procedures:  none  Antibiotics:  Anti-infectives   Start     Dose/Rate Route Frequency Ordered Stop   09/03/12 0600  vancomycin (VANCOCIN) IVPB 1000 mg/200 mL premix     1,000 mg 200 mL/hr over 60 Minutes Intravenous Every 12 hours 09/02/12 1719     09/02/12 2330  piperacillin-tazobactam (ZOSYN) IVPB 3.375 g     3.375 g 12.5 mL/hr over 240 Minutes Intravenous Every 8 hours 09/02/12 1554     09/02/12 1730  vancomycin (VANCOCIN) IVPB 1000 mg/200 mL premix     1,000 mg 200 mL/hr over 60 Minutes Intravenous  Once 09/02/12 1552 09/02/12 1859   09/02/12 1630  vancomycin (VANCOCIN) IVPB 1000  mg/200 mL premix     1,000 mg 200 mL/hr over 60 Minutes Intravenous  Once 09/02/12 1552 09/02/12 1713   09/02/12 1545  piperacillin-tazobactam (ZOSYN) IVPB 3.375 g     3.375 g 100 mL/hr over 30 Minutes Intravenous  Once 09/02/12 1541 09/02/12 1832   09/02/12 1545  vancomycin (VANCOCIN) IVPB 1000 mg/200 mL premix  Status:  Discontinued     1,000 mg 200 mL/hr over 60 Minutes Intravenous  Once 09/02/12 1541 09/02/12 1551     Antibiotics Given (last 72 hours)   Date/Time Action Medication Dose Rate   09/02/12 2240 Given   piperacillin-tazobactam (ZOSYN) IVPB 3.375 g 3.375 g 12.5 mL/hr   09/03/12 0544 Given   vancomycin (VANCOCIN) IVPB 1000 mg/200 mL premix 1,000 mg 200 mL/hr   09/03/12 0811 Given   piperacillin-tazobactam (ZOSYN) IVPB 3.375 g 3.375 g 12.5 mL/hr     HPI/Subjective: - feels "weird"  Objective: Filed Vitals:   09/03/12 0615 09/03/12 0630 09/03/12 0645 09/03/12 0800  BP:      Pulse: 97 98 96   Temp:    97.5 F (36.4 C)  TempSrc:    Oral  Resp: 14 15 14    Height:      Weight:      SpO2: 91% 92% 93%     Intake/Output Summary (Last 24 hours) at 09/03/12 0956 Last data filed at 09/03/12 0811  Gross per 24 hour  Intake   2005 ml  Output      1  ml  Net   2004 ml   Filed Weights   09/02/12 1644 09/02/12 2040  Weight: 93.441 kg (206 lb) 96.5 kg (212 lb 11.9 oz)    Exam:   General:  NAD, mildly tremulous  Cardiovascular: regular rate and rhythm, without MRG, tachycardic  Respiratory: good air movement, clear to auscultation throughout, no wheezing, ronchi or rales  Abdomen: soft, mildly tender to palpation, positive bowel sounds  MSK: no peripheral edema  Neuro: CN 2-12 grossly intact, MS 5/5 in all 4  Data Reviewed: Basic Metabolic Panel:  Recent Labs Lab 09/02/12 1552 09/03/12 0441  NA 139 142  K 4.0 3.6  CL 102 108  CO2 23 26  GLUCOSE 117* 95  BUN 18 9  CREATININE 0.70 0.62  CALCIUM 9.9 8.6  MG  --  1.6   Liver Function  Tests:  Recent Labs Lab 09/02/12 1552  AST 27  ALT 20  ALKPHOS 63  BILITOT 0.4  PROT 8.0  ALBUMIN 3.9    Recent Labs Lab 09/02/12 1552  LIPASE 39   CBC:  Recent Labs Lab 09/02/12 1552 09/03/12 0441  WBC 8.6 5.5  NEUTROABS 4.6  --   HGB 12.6 10.5*  HCT 37.6 33.6*  MCV 92.8 94.9  PLT 224 177   Cardiac Enzymes:  Recent Labs Lab 09/02/12 1552  TROPONINI <0.30   CBG:  Recent Labs Lab 09/02/12 2135 09/03/12 0740  GLUCAP 77 128*    Recent Results (from the past 240 hour(s))  RAPID STREP SCREEN     Status: None   Collection Time    09/02/12  4:14 PM      Result Value Range Status   Streptococcus, Group A Screen (Direct) NEGATIVE  NEGATIVE Final   Comment: (NOTE)     A Rapid Antigen test may result negative if the antigen level in the     sample is below the detection level of this test. The FDA has not     cleared this test as a stand-alone test therefore the rapid antigen     negative result has reflexed to a Group A Strep culture.  MRSA PCR SCREENING     Status: None   Collection Time    09/02/12  8:45 PM      Result Value Range Status   MRSA by PCR NEGATIVE  NEGATIVE Final   Comment:            The GeneXpert MRSA Assay (FDA     approved for NASAL specimens     only), is one component of a     comprehensive MRSA colonization     surveillance program. It is not     intended to diagnose MRSA     infection nor to guide or     monitor treatment for     MRSA infections.     Studies: Ct Head Wo Contrast  09/03/2012   *RADIOLOGY REPORT*  Clinical Data: Sepsis.  Headache.  Tinnitus.  Fever. Hallucinations.  Altered mental status.  CT HEAD WITHOUT CONTRAST  Technique:  Contiguous axial images were obtained from the base of the skull through the vertex without contrast.  Comparison: 04/21/2010.  Findings: No mass lesion, mass effect, midline shift, hydrocephalus, hemorrhage.  No territorial ischemia or acute infarction.  Mild intracranial atherosclerosis.   Mild chronic ischemic periventricular white matter disease.  Paranasal sinuses appear within normal limits.  Mastoid air cells clear.  IMPRESSION: Chronic ischemic white matter disease without acute intracranial abnormality.  Original Report Authenticated By: Andreas Newport, M.D.   Ct Angio Chest Pe W/cm &/or Wo Cm  09/02/2012   *RADIOLOGY REPORT*  Clinical Data:  Chest pain with tachycardia and fever.  History of hypertension and diabetes.  Question pulmonary embolism.  CT ANGIOGRAPHY CHEST CT ABDOMEN AND PELVIS WITH CONTRAST  Technique:  Multidetector CT imaging of the chest was performed using the standard protocol during bolus administration of intravenous contrast.  Multiplanar CT image reconstructions including MIPs were obtained to evaluate the vascular anatomy. Multidetector CT imaging of the abdomen and pelvis was performed using the standard protocol during bolus administration of intravenous contrast.  Contrast: 50mL OMNIPAQUE IOHEXOL 300 MG/ML  SOLN, OMNIPAQUE IOHEXOL 350 MG/ML SOLN  Comparison:  Chest CT 10/04/2009.  CTA CHEST  Findings:  The pulmonary arteries are well opacified with contrast. There is no evidence of acute pulmonary embolism.  Mild atherosclerosis of the aorta, great vessels and coronary arteries is noted.  There are no enlarged mediastinal or hilar lymph nodes.  There is no significant pleural or pericardial effusion.  Mild linear scarring is present in the left lower lobe.  The ground- glass opacities noted previously have resolved.  There is no airspace disease or endobronchial lesion.   Review of the MIP images confirms the above findings.  IMPRESSION: No evidence of acute pulmonary embolism or other acute chest process.  CT ABDOMEN AND PELVIS  Findings: There is a sub centimeter low density lesion in the right hepatic lobe on image 20.  The liver, gallbladder, biliary system and pancreas otherwise appear normal.  The spleen and adrenal glands appear normal.  There are  small low-density renal lesions bilaterally, too small to characterize.  No enhancing renal mass, hydronephrosis or urinary tract calculus is seen.  The stomach, small bowel, appendix and colon demonstrate no acute findings.  There is lobularity of the uterus consistent with small fibroids.  There is no adnexal mass.  The bladder appears normal. There are no enlarged abdominal pelvic lymph nodes.  Mild aorto iliac atherosclerosis is present.  There is facet disease in the lower lumbar spine with near anatomic alignment.  No acute osseous findings are seen.  Review of the MIP images confirms the above findings.  IMPRESSION:  1.  No acute abdominal pelvic findings demonstrated. 2.  Small low density hepatic and renal lesions, too small to characterize. 3.  Uterine fibroids.   Original Report Authenticated By: Carey Bullocks, M.D.   Ct Abdomen Pelvis W Contrast  09/02/2012   *RADIOLOGY REPORT*  Clinical Data:  Chest pain with tachycardia and fever.  History of hypertension and diabetes.  Question pulmonary embolism.  CT ANGIOGRAPHY CHEST CT ABDOMEN AND PELVIS WITH CONTRAST  Technique:  Multidetector CT imaging of the chest was performed using the standard protocol during bolus administration of intravenous contrast.  Multiplanar CT image reconstructions including MIPs were obtained to evaluate the vascular anatomy. Multidetector CT imaging of the abdomen and pelvis was performed using the standard protocol during bolus administration of intravenous contrast.  Contrast: 50mL OMNIPAQUE IOHEXOL 300 MG/ML  SOLN, OMNIPAQUE IOHEXOL 350 MG/ML SOLN  Comparison:  Chest CT 10/04/2009.  CTA CHEST  Findings:  The pulmonary arteries are well opacified with contrast. There is no evidence of acute pulmonary embolism.  Mild atherosclerosis of the aorta, great vessels and coronary arteries is noted.  There are no enlarged mediastinal or hilar lymph nodes.  There is no significant pleural or pericardial effusion.  Mild linear  scarring is present  in the left lower lobe.  The ground- glass opacities noted previously have resolved.  There is no airspace disease or endobronchial lesion.   Review of the MIP images confirms the above findings.  IMPRESSION: No evidence of acute pulmonary embolism or other acute chest process.  CT ABDOMEN AND PELVIS  Findings: There is a sub centimeter low density lesion in the right hepatic lobe on image 20.  The liver, gallbladder, biliary system and pancreas otherwise appear normal.  The spleen and adrenal glands appear normal.  There are small low-density renal lesions bilaterally, too small to characterize.  No enhancing renal mass, hydronephrosis or urinary tract calculus is seen.  The stomach, small bowel, appendix and colon demonstrate no acute findings.  There is lobularity of the uterus consistent with small fibroids.  There is no adnexal mass.  The bladder appears normal. There are no enlarged abdominal pelvic lymph nodes.  Mild aorto iliac atherosclerosis is present.  There is facet disease in the lower lumbar spine with near anatomic alignment.  No acute osseous findings are seen.  Review of the MIP images confirms the above findings.  IMPRESSION:  1.  No acute abdominal pelvic findings demonstrated. 2.  Small low density hepatic and renal lesions, too small to characterize. 3.  Uterine fibroids.   Original Report Authenticated By: Carey Bullocks, M.D.   Dg Chest Port 1 View  09/02/2012   *RADIOLOGY REPORT*  Clinical Data: Anxiety with vomiting.  PORTABLE CHEST - 1 VIEW  Comparison: 04/25/2011 radiographs.  CT 10/04/2009.  Findings: 1603 hours. The heart size and mediastinal contours are normal. The lungs are clear. There is no pleural effusion or pneumothorax. No acute osseous findings are identified.  Paraspinal osteophytes are noted in the thoracic spine.  IMPRESSION: No active cardiopulmonary process.   Original Report Authenticated By: Carey Bullocks, M.D.    Scheduled Meds: .  acidophilus  2 capsule Oral Daily  . ALPRAZolam  0.5 mg Oral BID  . aspirin EC  81 mg Oral Daily  . ciprofloxacin  400 mg Intravenous BID  . enoxaparin (LOVENOX) injection  40 mg Subcutaneous Q24H  . insulin aspart  0-5 Units Subcutaneous QHS  . insulin aspart  0-9 Units Subcutaneous TID WC  . lisinopril  10 mg Oral Daily  . metoprolol succinate  25 mg Oral q morning - 10a  . metronidazole  500 mg Intravenous Q8H  . pantoprazole  40 mg Oral Daily  . PARoxetine  40 mg Oral Daily  . pneumococcal 23 valent vaccine  0.5 mL Intramuscular Tomorrow-1000  . simvastatin  20 mg Oral QHS   Continuous Infusions: . 0.9 % NaCl with KCl 20 mEq / L 150 mL/hr at 09/03/12 0800   Active Problems:   Tachycardia   SIRS (systemic inflammatory response syndrome)   Diarrhea   Obesity, Class III, BMI 40-49.9 (morbid obesity)   Adjustment disorder with mixed anxiety and depressed mood   Diabetes type 2, controlled   HTN (hypertension)  Time spent: 43  Pamella Pert, MD [facility] Pager [phone]. If 7 PM - 7 AM, please contact night-coverage at [email or web address], password [facility] 09/03/2012, 9:56 AM  LOS: 1 day

## 2012-09-04 LAB — BASIC METABOLIC PANEL
CO2: 26 mEq/L (ref 19–32)
GFR calc non Af Amer: >90 mL/min (ref >90)
Glucose, Bld: 131 mg/dL — ABNORMAL HIGH (ref 70–99)
Potassium: 4.2 mEq/L (ref 3.5–5.1)
Sodium: 145 mEq/L (ref 135–145)

## 2012-09-04 LAB — GI PATHOGEN PANEL BY PCR, STOOL
G lamblia by PCR: NEGATIVE
Rotavirus A by PCR: NEGATIVE
Salmonella by PCR: NEGATIVE
Shigella by PCR: NEGATIVE

## 2012-09-04 LAB — GLUCOSE, CAPILLARY
Glucose-Capillary: 108 mg/dL — ABNORMAL HIGH (ref 70–99)
Glucose-Capillary: 184 mg/dL — ABNORMAL HIGH (ref 70–99)
Glucose-Capillary: 164 mg/dL — ABNORMAL HIGH (ref 70–99)

## 2012-09-04 LAB — CBC
Hemoglobin: 10.9 g/dL — ABNORMAL LOW (ref 12.0–15.0)
RBC: 3.57 MIL/uL — ABNORMAL LOW (ref 3.87–5.11)
WBC: 4.6 10*3/uL (ref 4.0–10.5)

## 2012-09-04 NOTE — Progress Notes (Signed)
[facility] PROGRESS NOTE  CHETARA KROPP UJW:[id number] DOB: 1950/05/17 DOA: 09/02/2012 PCP: Default, Provider, MD  HPI: [name] is a 62 y.o. Female Obese Caucasian lady has been feeling sick for the past 2 weeks, having diarrhea and palpitations, but has been self managing because she felt it was just an exacerbation of her anxiety disorder. As she worsened she went to her PCP who noted a heart rate of 140 and sent her to the emergency room. The patient reports that she is has in fact been having loose watery stools about 3-4 each day for the past 2 weeks, and had one episode of profuse vomiting today.   Assessment/Plan:  Diarrhea - febrile on admission, elevated lactic acid, probably infectious.  - C diff, GI pathogen pending. - switch to Ciprofloxacin/Metronidazole - UA without evidence on infection, CXR without pneumonia - TSH normal - still has watery bowel movements, but able to tolerate po with minimal nausea.  Anxiety - seen by psychiatry as an outpatient. She appears to be on 2 benzodiazepines at home. She states that she takes it very infrequently, last doses were 1 week ago - she appeared tremulous on admission; endorses hallucinations at home. This appears somewhat like benzodiazepine withdrawal. Started scheduled low dose Xanax and is doing much better since. Tremors have disappeared and she denies hallucinations.  DM HTN Obesity  Code Status: Full Family Communication: none  Disposition Plan: inpatient  Consultants:  none  Procedures:  none  Antibiotics:  Anti-infectives   Start     Dose/Rate Route Frequency Ordered Stop   09/03/12 1200  metroNIDAZOLE (FLAGYL) IVPB 500 mg     500 mg 100 mL/hr over 60 Minutes Intravenous Every 8 hours 09/03/12 1001     09/03/12 1100  ciprofloxacin (CIPRO) IVPB 400 mg     400 mg 200 mL/hr over 60 Minutes Intravenous 2 times daily 09/03/12 1001     09/03/12 0600  vancomycin (VANCOCIN) IVPB 1000 mg/200 mL premix   Status:  Discontinued     1,000 mg 200 mL/hr over 60 Minutes Intravenous Every 12 hours 09/02/12 1719 09/03/12 1001   09/02/12 2330  piperacillin-tazobactam (ZOSYN) IVPB 3.375 g  Status:  Discontinued     3.375 g 12.5 mL/hr over 240 Minutes Intravenous Every 8 hours 09/02/12 1554 09/03/12 1001   09/02/12 1730  vancomycin (VANCOCIN) IVPB 1000 mg/200 mL premix     1,000 mg 200 mL/hr over 60 Minutes Intravenous  Once 09/02/12 1552 09/02/12 1859   09/02/12 1630  vancomycin (VANCOCIN) IVPB 1000 mg/200 mL premix     1,000 mg 200 mL/hr over 60 Minutes Intravenous  Once 09/02/12 1552 09/02/12 1713   09/02/12 1545  piperacillin-tazobactam (ZOSYN) IVPB 3.375 g     3.375 g 100 mL/hr over 30 Minutes Intravenous  Once 09/02/12 1541 09/02/12 1832   09/02/12 1545  vancomycin (VANCOCIN) IVPB 1000 mg/200 mL premix  Status:  Discontinued     1,000 mg 200 mL/hr over 60 Minutes Intravenous  Once 09/02/12 1541 09/02/12 1551     Antibiotics Given (last 72 hours)   Date/Time Action Medication Dose Rate   09/02/12 2240 Given   piperacillin-tazobactam (ZOSYN) IVPB 3.375 g 3.375 g 12.5 mL/hr   09/03/12 0544 Given   vancomycin (VANCOCIN) IVPB 1000 mg/200 mL premix 1,000 mg 200 mL/hr   09/03/12 0811 Given   piperacillin-tazobactam (ZOSYN) IVPB 3.375 g 3.375 g 12.5 mL/hr   09/03/12 1104 Given   ciprofloxacin (CIPRO) IVPB 400 mg 400 mg 200 mL/hr  09/03/12 1204 Given   metroNIDAZOLE (FLAGYL) IVPB 500 mg 500 mg 100 mL/hr   09/03/12 2230 Given   metroNIDAZOLE (FLAGYL) IVPB 500 mg 500 mg 100 mL/hr   09/03/12 2353 Given   ciprofloxacin (CIPRO) IVPB 400 mg 400 mg 200 mL/hr   09/04/12 2130 Given   metroNIDAZOLE (FLAGYL) IVPB 500 mg 500 mg 100 mL/hr   09/04/12 1059 Given   ciprofloxacin (CIPRO) IVPB 400 mg 400 mg 200 mL/hr   09/04/12 1347 Given   metroNIDAZOLE (FLAGYL) IVPB 500 mg 500 mg 100 mL/hr     HPI/Subjective: - feels better but with nausea; still has had 1 water bowel movement since this morning.    Objective: Filed Vitals:   09/04/12 0442 09/04/12 0443 09/04/12 0847 09/04/12 1255  BP: 98/80 149/89 125/81 102/70  Pulse: 101 94 91 84  Temp:   98.3 F (36.8 C) 98.1 F (36.7 C)  TempSrc:   Oral Oral  Resp:   18 18  Height:      Weight:      SpO2: 98% 95% 98% 97%    Intake/Output Summary (Last 24 hours) at 09/04/12 1411 Last data filed at 09/04/12 1300  Gross per 24 hour  Intake   3620 ml  Output    900 ml  Net   2720 ml   Filed Weights   09/02/12 1644 09/02/12 2040  Weight: 93.441 kg (206 lb) 96.5 kg (212 lb 11.9 oz)    Exam:   General:  NAD, somewhat anxious  Cardiovascular: regular rate and rhythm, without MRG  Respiratory: good air movement, clear to auscultation throughout, no wheezing, ronchi or rales  Abdomen: soft, mildly tender to palpation, positive bowel sounds  MSK: no peripheral edema  Neuro: CN 2-12 grossly intact, MS 5/5 in all 4  Data Reviewed: Basic Metabolic Panel:  Recent Labs Lab 09/02/12 1552 09/03/12 0441 09/04/12 0521  NA 139 142 145  K 4.0 3.6 4.2  CL 102 108 112  CO2 23 26 26   GLUCOSE 117* 95 131*  BUN 18 9 7   CREATININE 0.70 0.62 0.57  CALCIUM 9.9 8.6 8.7  MG  --  1.6  --    Liver Function Tests:  Recent Labs Lab 09/02/12 1552  AST 27  ALT 20  ALKPHOS 63  BILITOT 0.4  PROT 8.0  ALBUMIN 3.9    Recent Labs Lab 09/02/12 1552  LIPASE 39   CBC:  Recent Labs Lab 09/02/12 1552 09/03/12 0441 09/04/12 0521  WBC 8.6 5.5 4.6  NEUTROABS 4.6  --   --   HGB 12.6 10.5* 10.9*  HCT 37.6 33.6* 34.2*  MCV 92.8 94.9 95.8  PLT 224 177 142*   Cardiac Enzymes:  Recent Labs Lab 09/02/12 1552  TROPONINI <0.30   CBG:  Recent Labs Lab 09/03/12 1300 09/03/12 1708 09/03/12 2104 09/04/12 0741 09/04/12 1122  GLUCAP 239* 82 143* 108* 184*    Recent Results (from the past 240 hour(s))  CULTURE, BLOOD (ROUTINE X 2)     Status: None   Collection Time    09/02/12  3:50 PM      Result Value Range Status    Specimen Description BLOOD LEFT WRIST   Final   Special Requests BOTTLES DRAWN AEROBIC ONLY 8CC   Final   Culture NO GROWTH 2 DAYS   Final   Report Status PENDING   Incomplete  CULTURE, BLOOD (ROUTINE X 2)     Status: None   Collection Time    09/02/12  3:53 PM      Result Value Range Status   Specimen Description BLOOD LEFT ANTECUBITAL   Final   Special Requests     Final   Value: BOTTLES DRAWN AEROBIC AND ANAEROBIC AEB=15CC ANA=10CC   Culture NO GROWTH 2 DAYS   Final   Report Status PENDING   Incomplete  URINE CULTURE     Status: None   Collection Time    09/02/12  4:14 PM      Result Value Range Status   Specimen Description URINE, CATHETERIZED   Final   Special Requests NONE   Final   Culture  Setup Time 09/02/2012 17:00   Final   Colony Count NO GROWTH   Final   Culture NO GROWTH   Final   Report Status 09/03/2012 FINAL   Final  RAPID STREP SCREEN     Status: None   Collection Time    09/02/12  4:14 PM      Result Value Range Status   Streptococcus, Group A Screen (Direct) NEGATIVE  NEGATIVE Final   Comment: (NOTE)     A Rapid Antigen test may result negative if the antigen level in the     sample is below the detection level of this test. The FDA has not     cleared this test as a stand-alone test therefore the rapid antigen     negative result has reflexed to a Group A Strep culture.  CULTURE, GROUP A STREP     Status: None   Collection Time    09/02/12  4:15 PM      Result Value Range Status   Specimen Description THROAT   Final   Special Requests NONE   Final   Culture NO SUSPICIOUS COLONIES, CONTINUING TO HOLD   Final   Report Status PENDING   Incomplete  MRSA PCR SCREENING     Status: None   Collection Time    09/02/12  8:45 PM      Result Value Range Status   MRSA by PCR NEGATIVE  NEGATIVE Final   Comment:            The GeneXpert MRSA Assay (FDA     approved for NASAL specimens     only), is one component of a     comprehensive MRSA colonization      surveillance program. It is not     intended to diagnose MRSA     infection nor to guide or     monitor treatment for     MRSA infections.  CLOSTRIDIUM DIFFICILE BY PCR     Status: None   Collection Time    09/03/12  2:30 PM      Result Value Range Status   C difficile by pcr NEGATIVE  NEGATIVE Final     Studies: Ct Head Wo Contrast  09/03/2012   *RADIOLOGY REPORT*  Clinical Data: Sepsis.  Headache.  Tinnitus.  Fever. Hallucinations.  Altered mental status.  CT HEAD WITHOUT CONTRAST  Technique:  Contiguous axial images were obtained from the base of the skull through the vertex without contrast.  Comparison: 04/21/2010.  Findings: No mass lesion, mass effect, midline shift, hydrocephalus, hemorrhage.  No territorial ischemia or acute infarction.  Mild intracranial atherosclerosis.  Mild chronic ischemic periventricular white matter disease.  Paranasal sinuses appear within normal limits.  Mastoid air cells clear.  IMPRESSION: Chronic ischemic white matter disease without acute intracranial abnormality.   Original Report Authenticated By: Andreas Newport, M.D.  Ct Angio Chest Pe W/cm &/or Wo Cm  09/02/2012   *RADIOLOGY REPORT*  Clinical Data:  Chest pain with tachycardia and fever.  History of hypertension and diabetes.  Question pulmonary embolism.  CT ANGIOGRAPHY CHEST CT ABDOMEN AND PELVIS WITH CONTRAST  Technique:  Multidetector CT imaging of the chest was performed using the standard protocol during bolus administration of intravenous contrast.  Multiplanar CT image reconstructions including MIPs were obtained to evaluate the vascular anatomy. Multidetector CT imaging of the abdomen and pelvis was performed using the standard protocol during bolus administration of intravenous contrast.  Contrast: 50mL OMNIPAQUE IOHEXOL 300 MG/ML  SOLN, OMNIPAQUE IOHEXOL 350 MG/ML SOLN  Comparison:  Chest CT 10/04/2009.  CTA CHEST  Findings:  The pulmonary arteries are well opacified with contrast. There  is no evidence of acute pulmonary embolism.  Mild atherosclerosis of the aorta, great vessels and coronary arteries is noted.  There are no enlarged mediastinal or hilar lymph nodes.  There is no significant pleural or pericardial effusion.  Mild linear scarring is present in the left lower lobe.  The ground- glass opacities noted previously have resolved.  There is no airspace disease or endobronchial lesion.   Review of the MIP images confirms the above findings.  IMPRESSION: No evidence of acute pulmonary embolism or other acute chest process.  CT ABDOMEN AND PELVIS  Findings: There is a sub centimeter low density lesion in the right hepatic lobe on image 20.  The liver, gallbladder, biliary system and pancreas otherwise appear normal.  The spleen and adrenal glands appear normal.  There are small low-density renal lesions bilaterally, too small to characterize.  No enhancing renal mass, hydronephrosis or urinary tract calculus is seen.  The stomach, small bowel, appendix and colon demonstrate no acute findings.  There is lobularity of the uterus consistent with small fibroids.  There is no adnexal mass.  The bladder appears normal. There are no enlarged abdominal pelvic lymph nodes.  Mild aorto iliac atherosclerosis is present.  There is facet disease in the lower lumbar spine with near anatomic alignment.  No acute osseous findings are seen.  Review of the MIP images confirms the above findings.  IMPRESSION:  1.  No acute abdominal pelvic findings demonstrated. 2.  Small low density hepatic and renal lesions, too small to characterize. 3.  Uterine fibroids.   Original Report Authenticated By: Carey Bullocks, M.D.   Ct Abdomen Pelvis W Contrast  09/02/2012   *RADIOLOGY REPORT*  Clinical Data:  Chest pain with tachycardia and fever.  History of hypertension and diabetes.  Question pulmonary embolism.  CT ANGIOGRAPHY CHEST CT ABDOMEN AND PELVIS WITH CONTRAST  Technique:  Multidetector CT imaging of the chest  was performed using the standard protocol during bolus administration of intravenous contrast.  Multiplanar CT image reconstructions including MIPs were obtained to evaluate the vascular anatomy. Multidetector CT imaging of the abdomen and pelvis was performed using the standard protocol during bolus administration of intravenous contrast.  Contrast: 50mL OMNIPAQUE IOHEXOL 300 MG/ML  SOLN, OMNIPAQUE IOHEXOL 350 MG/ML SOLN  Comparison:  Chest CT 10/04/2009.  CTA CHEST  Findings:  The pulmonary arteries are well opacified with contrast. There is no evidence of acute pulmonary embolism.  Mild atherosclerosis of the aorta, great vessels and coronary arteries is noted.  There are no enlarged mediastinal or hilar lymph nodes.  There is no significant pleural or pericardial effusion.  Mild linear scarring is present in the left lower lobe.  The ground- glass  opacities noted previously have resolved.  There is no airspace disease or endobronchial lesion.   Review of the MIP images confirms the above findings.  IMPRESSION: No evidence of acute pulmonary embolism or other acute chest process.  CT ABDOMEN AND PELVIS  Findings: There is a sub centimeter low density lesion in the right hepatic lobe on image 20.  The liver, gallbladder, biliary system and pancreas otherwise appear normal.  The spleen and adrenal glands appear normal.  There are small low-density renal lesions bilaterally, too small to characterize.  No enhancing renal mass, hydronephrosis or urinary tract calculus is seen.  The stomach, small bowel, appendix and colon demonstrate no acute findings.  There is lobularity of the uterus consistent with small fibroids.  There is no adnexal mass.  The bladder appears normal. There are no enlarged abdominal pelvic lymph nodes.  Mild aorto iliac atherosclerosis is present.  There is facet disease in the lower lumbar spine with near anatomic alignment.  No acute osseous findings are seen.  Review of the MIP images  confirms the above findings.  IMPRESSION:  1.  No acute abdominal pelvic findings demonstrated. 2.  Small low density hepatic and renal lesions, too small to characterize. 3.  Uterine fibroids.   Original Report Authenticated By: Carey Bullocks, M.D.   Dg Chest Port 1 View  09/02/2012   *RADIOLOGY REPORT*  Clinical Data: Anxiety with vomiting.  PORTABLE CHEST - 1 VIEW  Comparison: 04/25/2011 radiographs.  CT 10/04/2009.  Findings: 1603 hours. The heart size and mediastinal contours are normal. The lungs are clear. There is no pleural effusion or pneumothorax. No acute osseous findings are identified.  Paraspinal osteophytes are noted in the thoracic spine.  IMPRESSION: No active cardiopulmonary process.   Original Report Authenticated By: Carey Bullocks, M.D.    Scheduled Meds: . acidophilus  2 capsule Oral Daily  . ALPRAZolam  0.5 mg Oral BID  . aspirin EC  81 mg Oral Daily  . ciprofloxacin  400 mg Intravenous BID  . enoxaparin (LOVENOX) injection  40 mg Subcutaneous Q24H  . insulin aspart  0-5 Units Subcutaneous QHS  . insulin aspart  0-9 Units Subcutaneous TID WC  . lisinopril  10 mg Oral Daily  . metoprolol succinate  25 mg Oral q morning - 10a  . metronidazole  500 mg Intravenous Q8H  . pantoprazole  40 mg Oral Daily  . PARoxetine  40 mg Oral Daily  . simvastatin  20 mg Oral QHS   Continuous Infusions: . 0.9 % NaCl with KCl 20 mEq / L 150 mL/hr at 09/04/12 9604   Active Problems:   Tachycardia   SIRS (systemic inflammatory response syndrome)   Diarrhea   Obesity, Class III, BMI 40-49.9 (morbid obesity)   Adjustment disorder with mixed anxiety and depressed mood   Diabetes type 2, controlled   HTN (hypertension)  Time spent: 60  Pamella Pert, MD [facility] Pager [phone]. If 7 PM - 7 AM, please contact night-coverage at [email or web address], password [facility] 09/04/2012, 2:11 PM  LOS: 2 days

## 2012-09-05 LAB — GLUCOSE, CAPILLARY

## 2012-09-05 LAB — CULTURE, GROUP A STREP

## 2012-09-05 MED ORDER — METRONIDAZOLE 500 MG PO TABS: 500.0000 mg | ORAL_TABLET | Freq: Three times a day (TID) | ORAL | Status: DC

## 2012-09-05 MED ORDER — CIPROFLOXACIN HCL 500 MG PO TABS: 500.0000 mg | ORAL_TABLET | Freq: Two times a day (BID) | ORAL | Status: DC

## 2012-09-05 NOTE — Discharge Summary (Signed)
Physician Discharge Summary  OSHA RANE ZOX:[id number] DOB: 1950-09-29 DOA: 09/02/2012  PCP: Default, Provider, MD  Admit date: 09/02/2012 Discharge date: 09/05/2012  Time spent: 45 minutes  Recommendations for Outpatient Follow-up:  1. Follow up with PCP and Psychiatrist within 1-2 weeks   Discharge Diagnoses:  Active Problems:   Tachycardia   SIRS (systemic inflammatory response syndrome)   Diarrhea   Obesity, Class III, BMI 40-49.9 (morbid obesity)   Adjustment disorder with mixed anxiety and depressed mood   Diabetes type 2, controlled   HTN (hypertension)  Discharge Condition: stable  Diet recommendation: heart healthy  Filed Weights   09/02/12 1644 09/02/12 2040 09/05/12 0446  Weight: 93.441 kg (206 lb) 96.5 kg (212 lb 11.9 oz) 99.973 kg (220 lb 6.4 oz)   History of present illness:  [name] is a 62 y.o. Female Obese Caucasian lady has been feeling sick for the past 2 weeks, having diarrhea and palpitations, but has been self managing because she felt it was just an exacerbation of her anxiety disorder. As she worsened she went to her PCP who noted a heart rate of 140 and sent her to the emergency room. The patient reports that she is has in fact been having loose watery stools about 3-4 each day for the past 2 weeks, and had one episode of profuse vomiting the day of admission. For the past couple of days she's been having visual hallucinatory phenomena, which has made her really afraid . She recognizes that there are not real. Other symptoms that have been present since the beginning of her illness are a melodious ringing in her right ear, and a throbbing frontal headache. She was taken no new medications In the emergency room she was she was found to be febrile, 101, to be having sinus tachycardia into the 140s, which came down into the 120s after vigorous hydration. She has had CT scans of the chest and abdomen and no focal abnormality has been found. Her lactic  acid was initially a 3.0, but came down to normal after hydration; Procalcitonin was negative for evidence of systemic bacterial infection. Her serum chemistry was surprisingly normal  Hospital Course:  Diarrhea - febrile on admission, elevated lactic acid, probably infectious. C diff negative, GI pathogen panel negative. She did improve on Ciprofloxacin and Metronidazole. Her diarrhea has also improved and patient was able to tolerate adequate po intake without nausea/vomiting. UA without evidence on infection, CXR without pneumonia. TSH was normal.  Anxiety - seen by psychiatry as an outpatient. She appears to be on 2 benzodiazepines at home. She states that she takes it very infrequently, last doses were 1 week ago. She appeared tremulous on admission; endorsed hallucinations at home. This appears somewhat like benzodiazepine withdrawal. Started scheduled low dose Xanax and is doing much better since. Tremors have disappeared and she denies hallucinations. Instructed to call her psychiatrist for a short term follow up appointment and discuss this further.  DM, HTN - continue home medications Obesity - counseled for weight loss.   Procedures:  none   Consultations:  none  Discharge Exam: Filed Vitals:   09/05/12 0446 09/05/12 0550 09/05/12 0555 09/05/12 0600  BP:  116/71 132/71 124/79  Pulse:  84 98 93  Temp:  97.7 F (36.5 C)    TempSrc:  Oral    Resp:  20 20 20   Height:      Weight: 99.973 kg (220 lb 6.4 oz)     SpO2:  93% 98% 96%  General: NAD Cardiovascular: RRR Respiratory: CTA biL  Discharge Instructions    Medication List         ALPRAZolam 0.5 MG tablet  Commonly known as:  XANAX  Take 0.5 mg by mouth daily as needed for sleep or anxiety.     aspirin EC 81 MG tablet  Take 324 mg by mouth once. *Given by [facility]  Prior to admission*     ciprofloxacin 500 MG tablet  Commonly known as:  CIPRO  Take 1 tablet (500 mg total) by mouth 2 (two) times  daily.     glimepiride 2 MG tablet  Commonly known as:  AMARYL  Take 2 mg by mouth 2 (two) times daily.     isosorbide mononitrate 30 MG 24 hr tablet  Commonly known as:  IMDUR  Take 30 mg by mouth every morning.     lisinopril 10 MG tablet  Commonly known as:  PRINIVIL,ZESTRIL  Take 10 mg by mouth daily.     metFORMIN 1000 MG tablet  Commonly known as:  GLUCOPHAGE  Take 1,000 mg by mouth 2 (two) times daily with a meal.     metoprolol succinate 25 MG 24 hr tablet  Commonly known as:  TOPROL-XL  Take 25 mg by mouth every morning.     metroNIDAZOLE 500 MG tablet  Commonly known as:  FLAGYL  Take 1 tablet (500 mg total) by mouth 3 (three) times daily.     omeprazole 20 MG capsule  Commonly known as:  PRILOSEC  Take 20 mg by mouth every morning.     PARoxetine 40 MG tablet  Commonly known as:  PAXIL  Take 40 mg by mouth 2 (two) times daily.     simvastatin 20 MG tablet  Commonly known as:  ZOCOR  Take 20 mg by mouth at bedtime.     temazepam 15 MG capsule  Commonly known as:  RESTORIL  Take 15 mg by mouth at bedtime as needed for sleep.           Follow-up Information   Follow up with Primary care MD. Schedule an appointment as soon as possible for a visit in 3 days.      Follow up with Primary psychiatrist. Schedule an appointment as soon as possible for a visit in 2 weeks.      Follow up with [name], MD On 09/08/2012. (Follow-up appointment scheduled for Monday, August 4th at 11:30 AM)    Contact information:   [address] [place] [address] [phone]       Schedule an appointment as soon as possible for a visit on 09/15/2012 to follow up. (Follow-up with Dr. Janeece Riggers Monday, August 11th at 1:45 PM)    Contact information:   [facility] [address] [place], [place] [address] [phone]       The results of significant diagnostics from this hospitalization (including imaging, microbiology, ancillary and laboratory)  are listed below for reference.    Significant Diagnostic Studies: Ct Head Wo Contrast  09/03/2012   *RADIOLOGY REPORT*  Clinical Data: Sepsis.  Headache.  Tinnitus.  Fever. Hallucinations.  Altered mental status.  CT HEAD WITHOUT CONTRAST  Technique:  Contiguous axial images were obtained from the base of the skull through the vertex without contrast.  Comparison: 04/21/2010.  Findings: No mass lesion, mass effect, midline shift, hydrocephalus, hemorrhage.  No territorial ischemia or acute infarction.  Mild intracranial atherosclerosis.  Mild chronic ischemic periventricular white  matter disease.  Paranasal sinuses appear within normal limits.  Mastoid air cells clear.  IMPRESSION: Chronic ischemic white matter disease without acute intracranial abnormality.   Original Report Authenticated By: Andreas Newport, M.D.   Ct Angio Chest Pe W/cm &/or Wo Cm  09/02/2012   *RADIOLOGY REPORT*  Clinical Data:  Chest pain with tachycardia and fever.  History of hypertension and diabetes.  Question pulmonary embolism.  CT ANGIOGRAPHY CHEST CT ABDOMEN AND PELVIS WITH CONTRAST  Technique:  Multidetector CT imaging of the chest was performed using the standard protocol during bolus administration of intravenous contrast.  Multiplanar CT image reconstructions including MIPs were obtained to evaluate the vascular anatomy. Multidetector CT imaging of the abdomen and pelvis was performed using the standard protocol during bolus administration of intravenous contrast.  Contrast: 50mL OMNIPAQUE IOHEXOL 300 MG/ML  SOLN, OMNIPAQUE IOHEXOL 350 MG/ML SOLN  Comparison:  Chest CT 10/04/2009.  CTA CHEST  Findings:  The pulmonary arteries are well opacified with contrast. There is no evidence of acute pulmonary embolism.  Mild atherosclerosis of the aorta, great vessels and coronary arteries is noted.  There are no enlarged mediastinal or hilar lymph nodes.  There is no significant pleural or pericardial effusion.  Mild linear  scarring is present in the left lower lobe.  The ground- glass opacities noted previously have resolved.  There is no airspace disease or endobronchial lesion.   Review of the MIP images confirms the above findings.  IMPRESSION: No evidence of acute pulmonary embolism or other acute chest process.  CT ABDOMEN AND PELVIS  Findings: There is a sub centimeter low density lesion in the right hepatic lobe on image 20.  The liver, gallbladder, biliary system and pancreas otherwise appear normal.  The spleen and adrenal glands appear normal.  There are small low-density renal lesions bilaterally, too small to characterize.  No enhancing renal mass, hydronephrosis or urinary tract calculus is seen.  The stomach, small bowel, appendix and colon demonstrate no acute findings.  There is lobularity of the uterus consistent with small fibroids.  There is no adnexal mass.  The bladder appears normal. There are no enlarged abdominal pelvic lymph nodes.  Mild aorto iliac atherosclerosis is present.  There is facet disease in the lower lumbar spine with near anatomic alignment.  No acute osseous findings are seen.  Review of the MIP images confirms the above findings.  IMPRESSION:  1.  No acute abdominal pelvic findings demonstrated. 2.  Small low density hepatic and renal lesions, too small to characterize. 3.  Uterine fibroids.   Original Report Authenticated By: Carey Bullocks, M.D.   Ct Abdomen Pelvis W Contrast  09/02/2012   *RADIOLOGY REPORT*  Clinical Data:  Chest pain with tachycardia and fever.  History of hypertension and diabetes.  Question pulmonary embolism.  CT ANGIOGRAPHY CHEST CT ABDOMEN AND PELVIS WITH CONTRAST  Technique:  Multidetector CT imaging of the chest was performed using the standard protocol during bolus administration of intravenous contrast.  Multiplanar CT image reconstructions including MIPs were obtained to evaluate the vascular anatomy. Multidetector CT imaging of the abdomen and pelvis was  performed using the standard protocol during bolus administration of intravenous contrast.  Contrast: 50mL OMNIPAQUE IOHEXOL 300 MG/ML  SOLN, OMNIPAQUE IOHEXOL 350 MG/ML SOLN  Comparison:  Chest CT 10/04/2009.  CTA CHEST  Findings:  The pulmonary arteries are well opacified with contrast. There is no evidence of acute pulmonary embolism.  Mild atherosclerosis of the aorta, great vessels and coronary arteries  is noted.  There are no enlarged mediastinal or hilar lymph nodes.  There is no significant pleural or pericardial effusion.  Mild linear scarring is present in the left lower lobe.  The ground- glass opacities noted previously have resolved.  There is no airspace disease or endobronchial lesion.   Review of the MIP images confirms the above findings.  IMPRESSION: No evidence of acute pulmonary embolism or other acute chest process.  CT ABDOMEN AND PELVIS  Findings: There is a sub centimeter low density lesion in the right hepatic lobe on image 20.  The liver, gallbladder, biliary system and pancreas otherwise appear normal.  The spleen and adrenal glands appear normal.  There are small low-density renal lesions bilaterally, too small to characterize.  No enhancing renal mass, hydronephrosis or urinary tract calculus is seen.  The stomach, small bowel, appendix and colon demonstrate no acute findings.  There is lobularity of the uterus consistent with small fibroids.  There is no adnexal mass.  The bladder appears normal. There are no enlarged abdominal pelvic lymph nodes.  Mild aorto iliac atherosclerosis is present.  There is facet disease in the lower lumbar spine with near anatomic alignment.  No acute osseous findings are seen.  Review of the MIP images confirms the above findings.  IMPRESSION:  1.  No acute abdominal pelvic findings demonstrated. 2.  Small low density hepatic and renal lesions, too small to characterize. 3.  Uterine fibroids.   Original Report Authenticated By: Carey Bullocks, M.D.    Dg Chest Port 1 View  09/02/2012   *RADIOLOGY REPORT*  Clinical Data: Anxiety with vomiting.  PORTABLE CHEST - 1 VIEW  Comparison: 04/25/2011 radiographs.  CT 10/04/2009.  Findings: 1603 hours. The heart size and mediastinal contours are normal. The lungs are clear. There is no pleural effusion or pneumothorax. No acute osseous findings are identified.  Paraspinal osteophytes are noted in the thoracic spine.  IMPRESSION: No active cardiopulmonary process.   Original Report Authenticated By: Carey Bullocks, M.D.    Microbiology: Recent Results (from the past 240 hour(s))  CULTURE, BLOOD (ROUTINE X 2)     Status: None   Collection Time    09/02/12  3:50 PM      Result Value Range Status   Specimen Description BLOOD LEFT WRIST   Final   Special Requests BOTTLES DRAWN AEROBIC ONLY 8CC   Final   Culture NO GROWTH 3 DAYS   Final   Report Status PENDING   Incomplete  CULTURE, BLOOD (ROUTINE X 2)     Status: None   Collection Time    09/02/12  3:53 PM      Result Value Range Status   Specimen Description BLOOD LEFT ANTECUBITAL   Final   Special Requests     Final   Value: BOTTLES DRAWN AEROBIC AND ANAEROBIC AEB=15CC ANA=10CC   Culture NO GROWTH 3 DAYS   Final   Report Status PENDING   Incomplete  URINE CULTURE     Status: None   Collection Time    09/02/12  4:14 PM      Result Value Range Status   Specimen Description URINE, CATHETERIZED   Final   Special Requests NONE   Final   Culture  Setup Time 09/02/2012 17:00   Final   Colony Count NO GROWTH   Final   Culture NO GROWTH   Final   Report Status 09/03/2012 FINAL   Final  RAPID STREP SCREEN     Status: None  Collection Time    09/02/12  4:14 PM      Result Value Range Status   Streptococcus, Group A Screen (Direct) NEGATIVE  NEGATIVE Final   Comment: (NOTE)     A Rapid Antigen test may result negative if the antigen level in the     sample is below the detection level of this test. The FDA has not     cleared this test as a  stand-alone test therefore the rapid antigen     negative result has reflexed to a Group A Strep culture.  CULTURE, GROUP A STREP     Status: None   Collection Time    09/02/12  4:15 PM      Result Value Range Status   Specimen Description THROAT   Final   Special Requests NONE   Final   Culture No Beta Hemolytic Streptococci Isolated   Final   Report Status 09/05/2012 FINAL   Final  MRSA PCR SCREENING     Status: None   Collection Time    09/02/12  8:45 PM      Result Value Range Status   MRSA by PCR NEGATIVE  NEGATIVE Final   Comment:            The GeneXpert MRSA Assay (FDA     approved for NASAL specimens     only), is one component of a     comprehensive MRSA colonization     surveillance program. It is not     intended to diagnose MRSA     infection nor to guide or     monitor treatment for     MRSA infections.  CLOSTRIDIUM DIFFICILE BY PCR     Status: None   Collection Time    09/03/12  2:30 PM      Result Value Range Status   C difficile by pcr NEGATIVE  NEGATIVE Final     Labs: Basic Metabolic Panel:  Recent Labs Lab 09/02/12 1552 09/03/12 0441 09/04/12 0521  NA 139 142 145  K 4.0 3.6 4.2  CL 102 108 112  CO2 23 26 26   GLUCOSE 117* 95 131*  BUN 18 9 7   CREATININE 0.70 0.62 0.57  CALCIUM 9.9 8.6 8.7  MG  --  1.6  --    Liver Function Tests:  Recent Labs Lab 09/02/12 1552  AST 27  ALT 20  ALKPHOS 63  BILITOT 0.4  PROT 8.0  ALBUMIN 3.9    Recent Labs Lab 09/02/12 1552  LIPASE 39   CBC:  Recent Labs Lab 09/02/12 1552 09/03/12 0441 09/04/12 0521  WBC 8.6 5.5 4.6  NEUTROABS 4.6  --   --   HGB 12.6 10.5* 10.9*  HCT 37.6 33.6* 34.2*  MCV 92.8 94.9 95.8  PLT 224 177 142*   Cardiac Enzymes:  Recent Labs Lab 09/02/12 1552  TROPONINI <0.30   BNP: CBG:  Recent Labs Lab 09/04/12 0741 09/04/12 1122 09/04/12 1612 09/04/12 2042 09/05/12 0718  GLUCAP 108* 184* 164* 164* 148*    Signed:  Lenia Housley  [facility] 09/05/2012, 10:36 AM

## 2012-09-05 NOTE — Progress Notes (Signed)
Patient with orders to be discharge home. Discharge instructions given, patient verbalized understanding. Prescriptions given. Patient in stable condition upon discharge. Patient left with daughter in private vehicle.

## 2012-09-08 LAB — CULTURE, BLOOD (ROUTINE X 2)
Culture: NO GROWTH
Culture: NO GROWTH

## 2013-02-16 ENCOUNTER — Other Ambulatory Visit: Payer: Self-pay | Admitting: Family Medicine

## 2013-02-16 DIAGNOSIS — N63 Unspecified lump in unspecified breast: Secondary | ICD-10-CM

## 2013-03-17 ENCOUNTER — Ambulatory Visit
Admission: RE | Admit: 2013-03-17 | Discharge: 2013-03-17 | Disposition: A | Discharge status: Home / Self Care | Payer: Medicare Other | Source: Ambulatory Visit | Attending: Family Medicine | Admitting: Family Medicine

## 2013-03-17 DIAGNOSIS — N63 Unspecified lump in unspecified breast: Secondary | ICD-10-CM

## 2014-02-15 ENCOUNTER — Other Ambulatory Visit: Payer: Self-pay | Admitting: Family Medicine

## 2014-02-15 DIAGNOSIS — R234 Changes in skin texture: Secondary | ICD-10-CM

## 2014-03-11 ENCOUNTER — Other Ambulatory Visit: Payer: Self-pay | Admitting: Family Medicine

## 2014-03-11 DIAGNOSIS — R234 Changes in skin texture: Secondary | ICD-10-CM

## 2014-04-30 ENCOUNTER — Other Ambulatory Visit: Payer: Self-pay | Admitting: Family Medicine

## 2014-04-30 DIAGNOSIS — N6019 Diffuse cystic mastopathy of unspecified breast: Secondary | ICD-10-CM

## 2014-04-30 DIAGNOSIS — Z872 Personal history of diseases of the skin and subcutaneous tissue: Secondary | ICD-10-CM

## 2014-04-30 DIAGNOSIS — R234 Changes in skin texture: Secondary | ICD-10-CM

## 2014-05-04 ENCOUNTER — Ambulatory Visit
Admission: RE | Admit: 2014-05-04 | Discharge: 2014-05-04 | Disposition: A | Discharge status: Home / Self Care | Payer: Medicare Other | Source: Ambulatory Visit | Attending: Family Medicine | Admitting: Family Medicine

## 2014-05-04 ENCOUNTER — Other Ambulatory Visit: Payer: Self-pay | Admitting: Family Medicine

## 2014-05-04 DIAGNOSIS — N6019 Diffuse cystic mastopathy of unspecified breast: Secondary | ICD-10-CM

## 2014-05-05 ENCOUNTER — Ambulatory Visit
Admission: RE | Admit: 2014-05-05 | Discharge: 2014-05-05 | Disposition: A | Discharge status: Home / Self Care | Payer: Medicare Other | Source: Ambulatory Visit | Attending: Family Medicine | Admitting: Family Medicine

## 2014-05-05 ENCOUNTER — Other Ambulatory Visit: Payer: Self-pay | Admitting: Family Medicine

## 2014-05-05 DIAGNOSIS — N6019 Diffuse cystic mastopathy of unspecified breast: Secondary | ICD-10-CM

## 2014-08-01 ENCOUNTER — Emergency Department (HOSPITAL_COMMUNITY)
Admission: EM | Admit: 2014-08-01 | Discharge: 2014-08-01 | Disposition: A | Discharge status: Home / Self Care | Payer: Medicare Other | Attending: Emergency Medicine | Admitting: Emergency Medicine

## 2014-08-01 ENCOUNTER — Encounter (HOSPITAL_COMMUNITY): Payer: Self-pay | Admitting: Emergency Medicine

## 2014-08-01 ENCOUNTER — Emergency Department (HOSPITAL_COMMUNITY): Payer: Medicare Other

## 2014-08-01 DIAGNOSIS — Y998 Other external cause status: Secondary | ICD-10-CM | POA: Insufficient documentation

## 2014-08-01 DIAGNOSIS — Z7982 Long term (current) use of aspirin: Secondary | ICD-10-CM | POA: Diagnosis not present

## 2014-08-01 DIAGNOSIS — F419 Anxiety disorder, unspecified: Secondary | ICD-10-CM | POA: Insufficient documentation

## 2014-08-01 DIAGNOSIS — E780 Pure hypercholesterolemia: Secondary | ICD-10-CM | POA: Insufficient documentation

## 2014-08-01 DIAGNOSIS — W01198A Fall on same level from slipping, tripping and stumbling with subsequent striking against other object, initial encounter: Secondary | ICD-10-CM | POA: Insufficient documentation

## 2014-08-01 DIAGNOSIS — E119 Type 2 diabetes mellitus without complications: Secondary | ICD-10-CM | POA: Diagnosis not present

## 2014-08-01 DIAGNOSIS — Y9389 Activity, other specified: Secondary | ICD-10-CM | POA: Insufficient documentation

## 2014-08-01 DIAGNOSIS — S2231XA Fracture of one rib, right side, initial encounter for closed fracture: Secondary | ICD-10-CM | POA: Diagnosis not present

## 2014-08-01 DIAGNOSIS — Z792 Long term (current) use of antibiotics: Secondary | ICD-10-CM | POA: Diagnosis not present

## 2014-08-01 DIAGNOSIS — Z8739 Personal history of other diseases of the musculoskeletal system and connective tissue: Secondary | ICD-10-CM | POA: Diagnosis not present

## 2014-08-01 DIAGNOSIS — F329 Major depressive disorder, single episode, unspecified: Secondary | ICD-10-CM | POA: Diagnosis not present

## 2014-08-01 DIAGNOSIS — I10 Essential (primary) hypertension: Secondary | ICD-10-CM | POA: Diagnosis not present

## 2014-08-01 DIAGNOSIS — Y92830 Public park as the place of occurrence of the external cause: Secondary | ICD-10-CM | POA: Insufficient documentation

## 2014-08-01 DIAGNOSIS — S299XXA Unspecified injury of thorax, initial encounter: Secondary | ICD-10-CM | POA: Diagnosis present

## 2014-08-01 DIAGNOSIS — Z79899 Other long term (current) drug therapy: Secondary | ICD-10-CM | POA: Insufficient documentation

## 2014-08-01 MED ORDER — IBUPROFEN 800 MG PO TABS
800.0000 mg | ORAL_TABLET | Freq: Once | ORAL | Status: AC
  Administered 2014-08-01: 800 mg via ORAL
  Filled 2014-08-01: qty 1

## 2014-08-01 MED ORDER — OXYCODONE-ACETAMINOPHEN 5-325 MG PO TABS: 1.0000 | ORAL_TABLET | ORAL | Status: DC | PRN

## 2014-08-01 MED ORDER — NAPROXEN 500 MG PO TABS: 500.0000 mg | ORAL_TABLET | Freq: Two times a day (BID) | ORAL | Status: DC

## 2014-08-01 NOTE — ED Provider Notes (Signed)
CSN: [id number]     Arrival date & time 08/01/14  1755 History  This chart was scribed for non-physician practitioner, Kem Parkinson, PA-C, working with Tanna Furry, MD, by Peyton Bottoms ED Scribe. This patient was seen in room APFT24/APFT24 and the patient's care was started at 6:33 PM    Chief Complaint  Patient presents with  . Fall   Patient is a 64 y.o. female presenting with fall. The history is provided by the patient. No language interpreter was used.  Fall This is a new problem. The current episode started 1 to 2 hours ago. The problem occurs constantly. The problem has not changed since onset.Associated symptoms include chest pain. Pertinent negatives include no abdominal pain, no headaches and no shortness of breath. Nothing aggravates the symptoms. Nothing relieves the symptoms. She has tried nothing for the symptoms.    HPI Comments: [name] is a 64 y.o. female with a PMHx of hypertension, diabetes and DDD of cervical and lumbar spines, who presents to the Emergency Department complaining of moderate right sided rib pain onset 1.5 hours ago. Pt states she was sitting on a metal bench at a park and she tried to lift her leg over and fell backwards hitting her right side on the back of the bench. She denies associated LOC neck pain or head trauma. Pain to right chest is worse with movement and deep breathing.  Improves when completely still.  She denies any other associated injury, abdominal pain, shortness of breath or flank pain. No meds taken PTA.   Past Medical History  Diagnosis Date  . Hypertension   . Diabetes mellitus   . Hypercholesteremia   . Anxiety   . Depression   . DDD (degenerative disc disease), cervical   . DDD (degenerative disc disease), lumbar    Past Surgical History  Procedure Laterality Date  . Knee surgery    . Ankle surgery    . Dilation and curettage of uterus     No family history on file. History  Substance Use Topics  . Smoking  status: Never Smoker   . Smokeless tobacco: Never Used  . Alcohol Use: No   OB History    Gravida Para Term Preterm AB TAB SAB Ectopic Multiple Living   4 4 4       3      Review of Systems  Respiratory: Negative for cough and shortness of breath.   Cardiovascular: Positive for chest pain.       Right rib pain  Gastrointestinal: Negative for nausea and abdominal pain.  Genitourinary: Negative for flank pain.  Musculoskeletal: Negative for myalgias, back pain and neck pain.  Skin: Negative for wound.  Neurological: Negative for syncope, weakness, numbness and headaches.  All other systems reviewed and are negative.  Allergies  Review of patient's allergies indicates no known allergies.  Home Medications   Prior to Admission medications   Medication Sig Start Date End Date Taking? Authorizing Provider  ALPRAZolam Duanne Moron) 0.5 MG tablet Take 0.5 mg by mouth daily as needed for sleep or anxiety.    Historical Provider, MD  aspirin EC 81 MG tablet Take 324 mg by mouth once. *Given by [facility]  Prior to admission*    Historical Provider, MD  ciprofloxacin (CIPRO) 500 MG tablet Take 1 tablet (500 mg total) by mouth 2 (two) times daily. 09/05/12   Costin Karlyne Greenspan, MD  glimepiride (AMARYL) 2 MG tablet Take 2 mg by mouth 2 (two) times daily.  Historical Provider, MD  isosorbide mononitrate (IMDUR) 30 MG 24 hr tablet Take 30 mg by mouth every morning.    Historical Provider, MD  lisinopril (PRINIVIL,ZESTRIL) 10 MG tablet Take 10 mg by mouth daily.    Historical Provider, MD  metFORMIN (GLUCOPHAGE) 1000 MG tablet Take 1,000 mg by mouth 2 (two) times daily with a meal.    Historical Provider, MD  metoprolol succinate (TOPROL-XL) 25 MG 24 hr tablet Take 25 mg by mouth every morning.     Historical Provider, MD  metroNIDAZOLE (FLAGYL) 500 MG tablet Take 1 tablet (500 mg total) by mouth 3 (three) times daily. 09/05/12   Costin Karlyne Greenspan, MD  omeprazole (PRILOSEC) 20 MG capsule Take 20  mg by mouth every morning.    Historical Provider, MD  PARoxetine (PAXIL) 40 MG tablet Take 40 mg by mouth 2 (two) times daily.    Historical Provider, MD  simvastatin (ZOCOR) 20 MG tablet Take 20 mg by mouth at bedtime.    Historical Provider, MD  temazepam (RESTORIL) 15 MG capsule Take 15 mg by mouth at bedtime as needed for sleep.    Historical Provider, MD   Triage Vitals: BP 147/66 mmHg  Pulse 92  Temp(Src) 97.4 F (36.3 C) (Oral)  Resp 12  Ht 5' (1.524 m)  Wt 200 lb (90.719 kg)  BMI 39.06 kg/m2  SpO2 97%  Physical Exam  Constitutional: She is oriented to person, place, and time. She appears well-developed and well-nourished. No distress.  HENT:  Head: Normocephalic and atraumatic.  Eyes: Conjunctivae and EOM are normal.  Neck: Normal range of motion. Neck supple.  Cardiovascular: Normal rate, regular rhythm and normal heart sounds.  Exam reveals no friction rub.   No murmur heard. Pulmonary/Chest: Effort normal and breath sounds normal. No respiratory distress.  Localized tenderness to the anterior and lateral right ribs. Splinting on exam.  No bony deformity or crepitus. No ecchymosis or abrasion.  Abdominal: Soft. She exhibits no distension. There is no tenderness. There is no rebound and no guarding.  Musculoskeletal: Normal range of motion.  Neurological: She is alert and oriented to person, place, and time. Coordination normal.  Skin: Skin is warm and dry.  Psychiatric: She has a normal mood and affect. Her behavior is normal.  Nursing note and vitals reviewed.  ED Course  Procedures (including critical care time)  DIAGNOSTIC STUDIES: Oxygen Saturation is 97% on RA, normal by my interpretation.    COORDINATION OF CARE: 6:34 PM- Discussed plans to order diagnostic imaging of right ribs/chest. Will give pt ibuprofen. Pt is driving home. Will give pt prescription for pain medication. Pt advised of plan for treatment and pt agrees.   Labs Review Labs Reviewed - No  data to display  Imaging Review Dg Ribs Unilateral W/chest Right  08/01/2014   CLINICAL DATA:  Right rib pain after a fall on to a metal bench.  EXAM: RIGHT RIBS AND CHEST - 3+ VIEW  COMPARISON:  Chest x-ray dated 09/02/2012  FINDINGS: There is a slightly displaced fracture of the lateral aspect of the right eighth rib. No pneumothorax or pleural effusion. Heart size and pulmonary vascularity are normal. Lungs are clear. Degenerative osteophytes throughout the thoracic spine.  IMPRESSION: Slightly displaced fracture of the lateral aspect of the right eighth rib.   Electronically Signed   By: Lorriane Shire M.D.   On: 08/01/2014 19:22     EKG Interpretation None     MDM   Final diagnoses:  Closed rib  fracture, right, initial encounter    Pt is resting comfortably. Splinting on exam.  Vitals stable.  No tachypnea,tachycardia or hypoxia.  Pt agrees to use incentive spirometry and sx tx.  Close f/u with PMD, return precautions given.  I personally performed the services described in this documentation, which was scribed in my presence. The recorded information has been reviewed and is accurate.   Kem Parkinson, PA-C 08/03/14 1644  Tanna Furry, MD 08/09/14 2037

## 2014-08-01 NOTE — Discharge Instructions (Signed)

## 2014-08-01 NOTE — ED Notes (Signed)
Per patient was sitting on metal bench, "stradling bench," slide back to lift other leg over and fell backwards hitting right side and back on bench. Denies hitting head or LOC. Per patient right rib and back pain with increased pain with deep breath.

## 2014-08-02 MED FILL — Oxycodone w/ Acetaminophen Tab 5-325 MG: ORAL | Qty: 6 | Status: AC

## 2014-10-12 ENCOUNTER — Other Ambulatory Visit: Payer: Self-pay | Admitting: Geriatric Medicine

## 2014-10-12 DIAGNOSIS — N6489 Other specified disorders of breast: Secondary | ICD-10-CM

## 2014-10-16 IMAGING — CR DG CHEST 1V PORT
1 series · 1 of 1 positions shown · non-contrast
Comparison: 04/25/2011 radiographs.  CT 10/04/2009.

CLINICAL DATA: Anxiety with vomiting.

PORTABLE CHEST - 1 VIEW

[portable]
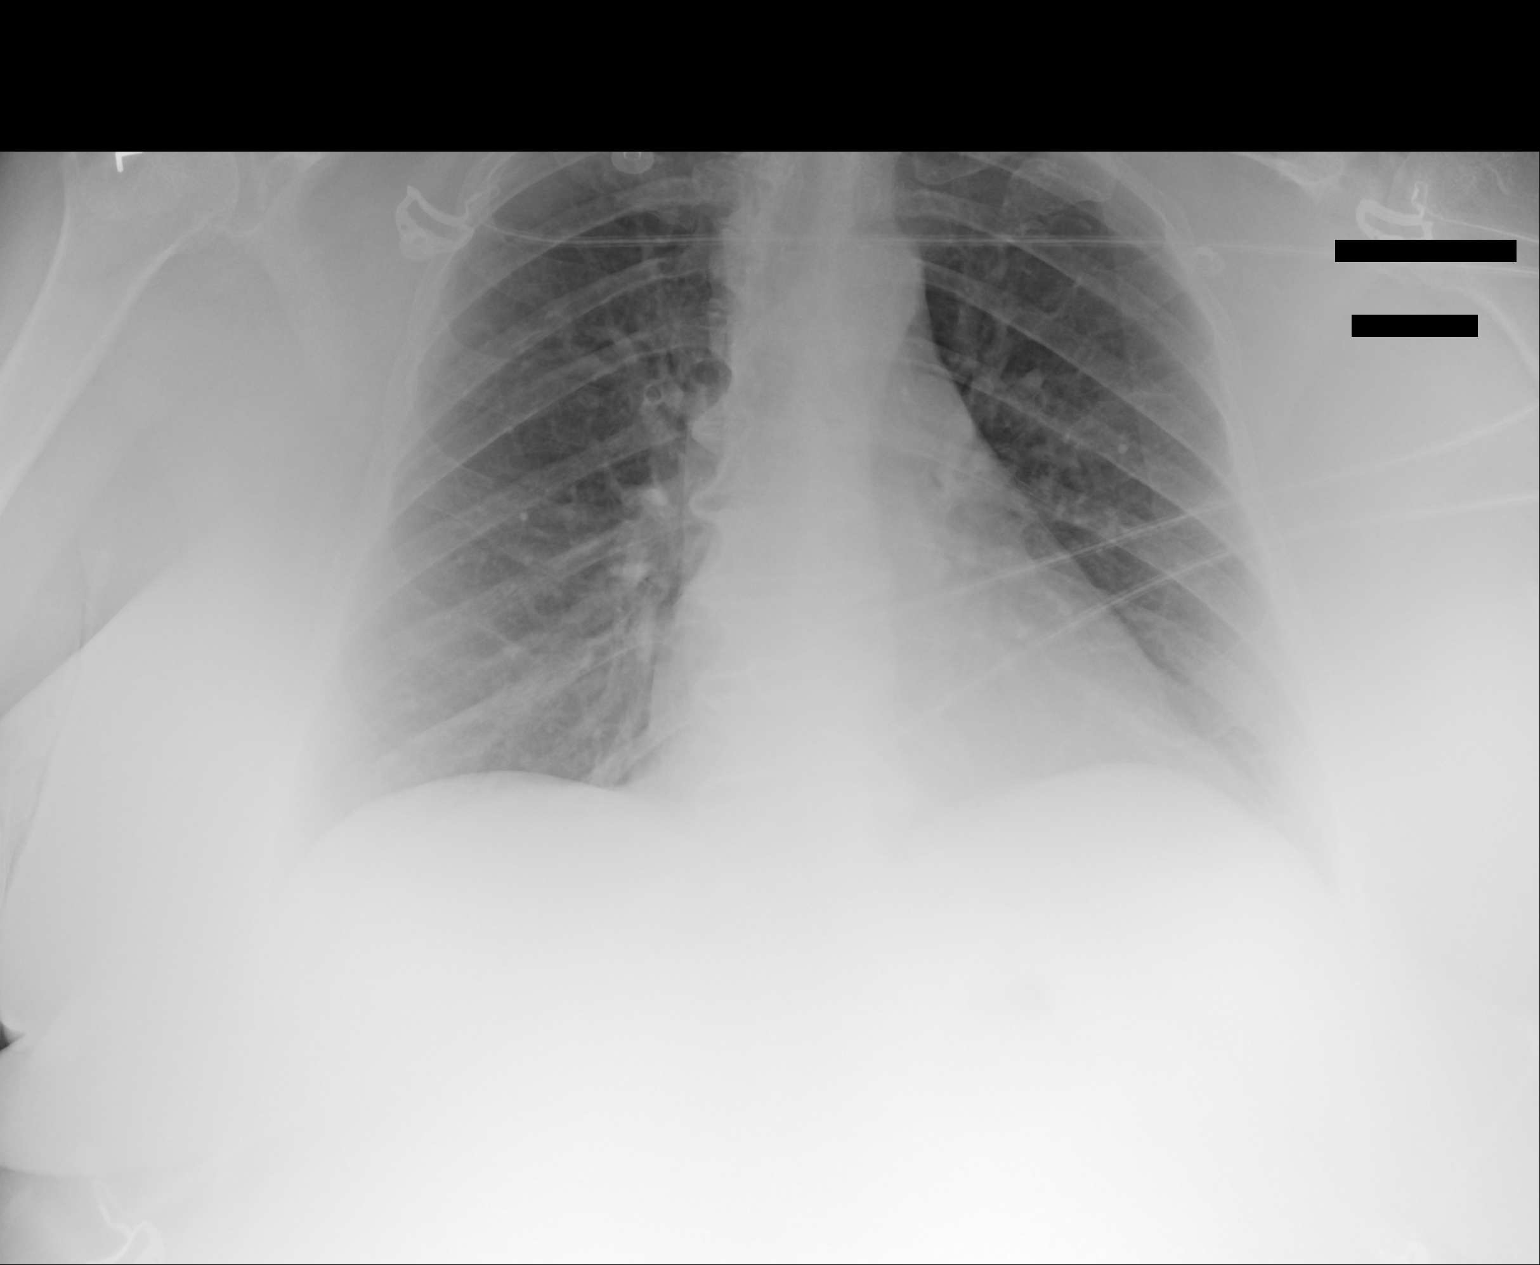

[1 of 1 positions shown; findings below may reference images not displayed]

FINDINGS: 7771 hours. The heart size and mediastinal contours are
normal. The lungs are clear. There is no pleural effusion or
pneumothorax. No acute osseous findings are identified.  Paraspinal
osteophytes are noted in the thoracic spine.
IMPRESSION: No active cardiopulmonary process.

## 2014-11-05 ENCOUNTER — Ambulatory Visit
Admission: RE | Admit: 2014-11-05 | Discharge: 2014-11-05 | Disposition: A | Discharge status: Home / Self Care | Payer: Medicare Other | Source: Ambulatory Visit | Attending: Geriatric Medicine | Admitting: Geriatric Medicine

## 2014-11-05 DIAGNOSIS — N6489 Other specified disorders of breast: Secondary | ICD-10-CM

## 2015-04-04 ENCOUNTER — Other Ambulatory Visit: Payer: Self-pay | Admitting: Family Medicine

## 2015-04-04 DIAGNOSIS — N6489 Other specified disorders of breast: Secondary | ICD-10-CM

## 2015-05-05 ENCOUNTER — Ambulatory Visit
Admission: RE | Admit: 2015-05-05 | Discharge: 2015-05-05 | Disposition: A | Discharge status: Home / Self Care | Payer: Medicare Other | Source: Ambulatory Visit | Attending: Family Medicine | Admitting: Family Medicine

## 2015-05-05 DIAGNOSIS — N6489 Other specified disorders of breast: Secondary | ICD-10-CM

## 2016-01-03 ENCOUNTER — Encounter: Payer: Self-pay | Admitting: Physician Assistant

## 2016-01-10 ENCOUNTER — Encounter (INDEPENDENT_AMBULATORY_CARE_PROVIDER_SITE_OTHER): Payer: Self-pay

## 2016-01-10 ENCOUNTER — Ambulatory Visit (INDEPENDENT_AMBULATORY_CARE_PROVIDER_SITE_OTHER): Payer: Medicare Other | Admitting: Physician Assistant

## 2016-01-10 ENCOUNTER — Other Ambulatory Visit (INDEPENDENT_AMBULATORY_CARE_PROVIDER_SITE_OTHER): Payer: Medicare Other

## 2016-01-10 ENCOUNTER — Encounter: Payer: Self-pay | Admitting: Physician Assistant

## 2016-01-10 VITALS — BP 132/82 | HR 83 | Ht 60.0 in | Wt 205.0 lb

## 2016-01-10 DIAGNOSIS — R197 Diarrhea, unspecified: Secondary | ICD-10-CM

## 2016-01-10 DIAGNOSIS — R1031 Right lower quadrant pain: Secondary | ICD-10-CM | POA: Diagnosis not present

## 2016-01-10 DIAGNOSIS — R109 Unspecified abdominal pain: Secondary | ICD-10-CM

## 2016-01-10 DIAGNOSIS — R1032 Left lower quadrant pain: Secondary | ICD-10-CM

## 2016-01-10 DIAGNOSIS — G8929 Other chronic pain: Secondary | ICD-10-CM

## 2016-01-10 DIAGNOSIS — K625 Hemorrhage of anus and rectum: Secondary | ICD-10-CM

## 2016-01-10 LAB — CBC WITH DIFFERENTIAL/PLATELET
BASOS PCT: 0.3 % (ref 0.0–3.0)
Basophils Absolute: 0 10*3/uL (ref 0.0–0.1)
EOS ABS: 0.2 10*3/uL (ref 0.0–0.7)
Eosinophils Relative: 1.9 % (ref 0.0–5.0)
HCT: 35 % — ABNORMAL LOW (ref 36.0–46.0)
HEMOGLOBIN: 11.6 g/dL — AB (ref 12.0–15.0)
Lymphocytes Relative: 35.8 % (ref 12.0–46.0)
Lymphs Abs: 3.2 10*3/uL (ref 0.7–4.0)
MCHC: 33.1 g/dL (ref 30.0–36.0)
MCV: 90.3 fl (ref 78.0–100.0)
MONO ABS: 0.7 10*3/uL (ref 0.1–1.0)
Monocytes Relative: 7.5 % (ref 3.0–12.0)
Neutro Abs: 4.9 10*3/uL (ref 1.4–7.7)
Neutrophils Relative %: 54.5 % (ref 43.0–77.0)
PLATELETS: 262 10*3/uL (ref 150.0–400.0)
RBC: 3.88 Mil/uL (ref 3.87–5.11)
RDW: 14 % (ref 11.5–15.5)
WBC: 9 10*3/uL (ref 4.0–10.5)

## 2016-01-10 LAB — BASIC METABOLIC PANEL
BUN: 10 mg/dL (ref 6–23)
CO2: 29 mEq/L (ref 19–32)
Calcium: 9.3 mg/dL (ref 8.4–10.5)
Chloride: 106 mEq/L (ref 96–112)
Creatinine, Ser: 0.78 mg/dL (ref 0.40–1.20)
GFR: 78.72 mL/min (ref >60.00)
GLUCOSE: 122 mg/dL — AB (ref 70–99)
POTASSIUM: 4.4 meq/L (ref 3.5–5.1)
SODIUM: 142 meq/L (ref 135–145)

## 2016-01-10 LAB — SEDIMENTATION RATE: Sed Rate: 53 mm/hr — ABNORMAL HIGH (ref 0–30)

## 2016-01-10 LAB — C-REACTIVE PROTEIN: CRP: 0.3 mg/dL — AB (ref 0.5–20.0)

## 2016-01-10 NOTE — Patient Instructions (Addendum)
Please go to the basement level to have your labs drawn.   You have been scheduled for a colonoscopy. Please follow written instructions given to you at your visit today.  Please pick up your prep supplies at the pharmacy within the next 1-3 days. If you use inhalers (even only as needed), please bring them with you on the day of your procedure. Your physician has requested that you go to www.startemmi.com and enter the access code given to you at your visit today. This web site gives a general overview about your procedure. However, you should still follow specific instructions given to you by our office regarding your preparation for the procedure. 

## 2016-01-10 NOTE — Progress Notes (Addendum)
Subjective:    Patient ID: [name], female    DOB: 12-Aug-1950, 65 y.o.   MRN: [id number]  HPI [name] is a pleasant 65 year old white female, new to GI today referred by Dr. Abran [name] M.D./[facility] for evaluation of lower abdominal pain diarrhea and rectal bleeding. Patient states that she did have one  previous colonoscopy done sometime before 2012 she believes in [facility]. She was told that this was a negative exam. She says over the past several months to a year she has had progressive difficulty with development of sharp lower abdominal crampy pains which she describes as "contractions". Abdominal pains are frequently associated with urgency for bowel movement. She says after any bowel movement she usually has pain in her lower abdomen for about 30 minutes. She has been having a lot of difficulty with symptoms at night stating that she is getting up 3-4 times at night with lower abdominal cramping and then diarrhea. She's not having any formed stools, says her stools are always loose and usually 5-6 bowel movements in a 24-hour period She has bright red blood with some of her bowel movements and sometimes just sees blood on the tissue. She does have external hemorrhoids. Her appetite has been okay, no significant weight loss no nausea or vomiting. She has not noted any definite worsening of symptoms after eating. She is on Glucophage but has been on stable doses over the past couple of years. Last labs done up proximally 4 months ago per patient were unremarkable. She's not had any recent imaging Family history negative for colon cancer or polyps Medical history pertinent for hypertension and adult-onset diabetes mellitus anxiety/depression and morbid obesity. No prior abdominal surgery  Review of Systems Pertinent positive and negative review of systems were noted in the above HPI section.  All other review of systems was otherwise negative.  Outpatient  Encounter Prescriptions as of 01/10/2016  Medication Sig  . buPROPion (WELLBUTRIN SR) 200 MG 12 hr tablet Take 200 mg by mouth daily.  Marland Kitchen glimepiride (AMARYL) 2 MG tablet Take 2 mg by mouth 2 (two) times daily.  . isosorbide mononitrate (IMDUR) 30 MG 24 hr tablet Take 30 mg by mouth every morning.  Marland Kitchen JANUVIA 100 MG tablet Take 100 mg by mouth daily.  Marland Kitchen lisinopril (PRINIVIL,ZESTRIL) 10 MG tablet Take 10 mg by mouth daily.  . metFORMIN (GLUCOPHAGE) 1000 MG tablet Take 1,000 mg by mouth 2 (two) times daily with a meal.  . metoprolol succinate (TOPROL-XL) 25 MG 24 hr tablet Take 25 mg by mouth every morning.   Marland Kitchen omeprazole (PRILOSEC) 20 MG capsule Take 20 mg by mouth every morning.  Marland Kitchen oxyCODONE-acetaminophen (PERCOCET/ROXICET) 5-325 MG per tablet Take 1 tablet by mouth every 4 (four) hours as needed.  Marland Kitchen PARoxetine (PAXIL) 40 MG tablet Take 20 mg by mouth daily.   . simvastatin (ZOCOR) 20 MG tablet Take 20 mg by mouth at bedtime.  . [DISCONTINUED] aspirin EC 81 MG tablet Take 81 mg by mouth daily.   . [DISCONTINUED] ciprofloxacin (CIPRO) 500 MG tablet Take 1 tablet (500 mg total) by mouth 2 (two) times daily.  . [DISCONTINUED] metroNIDAZOLE (FLAGYL) 500 MG tablet Take 1 tablet (500 mg total) by mouth 3 (three) times daily.  . [DISCONTINUED] naproxen (NAPROSYN) 500 MG tablet Take 1 tablet (500 mg total) by mouth 2 (two) times daily with a meal.  . [DISCONTINUED] oxyCODONE-acetaminophen (PERCOCET/ROXICET) 5-325 MG per tablet Take 1 tablet by mouth every 4 (four)  hours as needed.   No facility-administered encounter medications on file as of 01/10/2016.    No Known Allergies Patient Active Problem List   Diagnosis Date Noted  . Tachycardia 09/02/2012  . SIRS (systemic inflammatory response syndrome) ([place]) 09/02/2012  . Diarrhea 09/02/2012  . Obesity, Class III, BMI 40-49.9 (morbid obesity) ([place]) 09/02/2012  . Adjustment disorder with mixed anxiety and depressed mood 09/02/2012  . Diabetes type 2,  controlled ([place]) 09/02/2012  . HTN (hypertension) 09/02/2012   Social History   Social History  . Marital status: Married    Spouse name: N/A  . Number of children: N/A  . Years of education: N/A   Occupational History  . Not on file.   Social History Main Topics  . Smoking status: Never Smoker  . Smokeless tobacco: Never Used  . Alcohol use No  . Drug use: No  . Sexual activity: Not on file   Other Topics Concern  . Not on file   Social History Narrative  . No narrative on file    [name]'s family history is not on file.      Objective:    Vitals:   01/10/16 1108  BP: 132/82  Pulse: 83    Physical Exam well-developed older white female in no acute distress, pleasant blood pressure 132/82 pulse 83 , height 5 foot, weight 205, BMI of 40. HEENT ;nontraumatic normocephalic EOMI PERRLA sclera anicteric, Cardiovascular; regular rate and rhythm with S1-S2 no murmur or gallop, Pulmonary ;clear bilaterally, Abdomen; obese soft she is tender in the left mid left lower quadrant and across the suprapubic area, she also has some mild tenderness in the right upper quadrant there is no guarding or rebound no palpable mass or definite hepatosplenomegaly though exam limited by habitus, bowel sounds are present, Rectal; exam large external hemorrhoidal tags, no rectal lesion felt scant stool trace heme positive, Extremities; no clubbing cyanosis or edema skin warm and dry, Neuropsych ;mood and affect appropriate     Assessment & Plan:   #54 65 year old female with several month history of severe lower abdominal cramping, urgency for bowel movements, diarrhea and intermittent rectal bleeding. Patient having significant nighttime symptoms. Etiology is not clear but am concerned about underlying IBD or malignancy. #2 adult-onset diabetes mellitus #3 hypertension #4 morbid obesity-BMI 40   Plan; CBC with differential, BMET , sedimentation rate and CRP Start Bentyl 10 mg by mouth 3  times a day when necessary for cramping Schedule for colonoscopy with Dr. Ardis Hughs. Procedure discussed in detail with the patient including risks and benefits and she is agreeable to proceed. Further workup pending findings of above.  Addendum; Records received from prior colonoscopy. Patient had colonoscopy done in June 2015 by Dr. Anne Hahn in [facility] for complaints of rectal bleeding and an episode of severe diarrhea lasting 5 days. She was found to have a first-degree internal hemorrhoids and an otherwise normal exam with no polyps or diverticulosis noted.  Auburn Hester S Leeam Cedrone PA-C 01/10/2016   Cc: Tonya Richard, MD

## 2016-01-11 NOTE — Progress Notes (Signed)
I agree with the above note, plan 

## 2016-01-16 ENCOUNTER — Telehealth: Payer: Self-pay | Admitting: Gastroenterology

## 2016-01-16 ENCOUNTER — Encounter: Payer: Medicare Other | Admitting: Gastroenterology

## 2016-01-16 NOTE — Telephone Encounter (Signed)
No charge, thanks 

## 2016-02-09 ENCOUNTER — Encounter: Payer: Self-pay | Admitting: Physician Assistant

## 2016-03-21 ENCOUNTER — Encounter: Payer: Self-pay | Admitting: Gastroenterology

## 2016-03-21 ENCOUNTER — Ambulatory Visit (AMBULATORY_SURGERY_CENTER): Payer: Medicare Other | Admitting: Gastroenterology

## 2016-03-21 VITALS — BP 113/62 | HR 82 | Temp 97.3°F | Resp 16 | Ht 60.0 in | Wt 205.0 lb

## 2016-03-21 DIAGNOSIS — K649 Unspecified hemorrhoids: Secondary | ICD-10-CM | POA: Diagnosis not present

## 2016-03-21 DIAGNOSIS — K5730 Diverticulosis of large intestine without perforation or abscess without bleeding: Secondary | ICD-10-CM

## 2016-03-21 DIAGNOSIS — K625 Hemorrhage of anus and rectum: Secondary | ICD-10-CM

## 2016-03-21 DIAGNOSIS — R197 Diarrhea, unspecified: Secondary | ICD-10-CM | POA: Diagnosis not present

## 2016-03-21 DIAGNOSIS — D122 Benign neoplasm of ascending colon: Secondary | ICD-10-CM

## 2016-03-21 MED ORDER — SODIUM CHLORIDE 0.9 % IV SOLN: 500.0000 mL | INTRAVENOUS | Status: DC

## 2016-03-21 NOTE — Patient Instructions (Signed)
YOU HAD AN ENDOSCOPIC PROCEDURE TODAY AT THE [facility]:   Refer to the procedure report that was given to you for any specific questions about what was found during the examination.  If the procedure report does not answer your questions, please call your gastroenterologist to clarify.  If you requested that your care partner not be given the details of your procedure findings, then the procedure report has been included in a sealed envelope for you to review at your convenience later.  YOU SHOULD EXPECT: Some feelings of bloating in the abdomen. Passage of more gas than usual.  Walking can help get rid of the air that was put into your GI tract during the procedure and reduce the bloating. If you had a lower endoscopy (such as a colonoscopy or flexible sigmoidoscopy) you may notice spotting of blood in your stool or on the toilet paper. If you underwent a bowel prep for your procedure, you may not have a normal bowel movement for a few days.  Please Note:  You might notice some irritation and congestion in your nose or some drainage.  This is from the oxygen used during your procedure.  There is no need for concern and it should clear up in a day or so.  SYMPTOMS TO REPORT IMMEDIATELY:   Following lower endoscopy (colonoscopy or flexible sigmoidoscopy):  Excessive amounts of blood in the stool  Significant tenderness or worsening of abdominal pains  Swelling of the abdomen that is new, acute  Fever of 100F or higher  For urgent or emergent issues, a gastroenterologist can be reached at any hour by calling [phone].  DIET:  We do recommend a small meal at first, but then you may proceed to your regular diet.  Drink plenty of fluids but you should avoid alcoholic beverages for 24 hours.  ACTIVITY:  You should plan to take it easy for the rest of today and you should NOT DRIVE or use heavy machinery until tomorrow (because of the sedation medicines used during the test).     FOLLOW UP: Our staff will call the number listed on your records the next business day following your procedure to check on you and address any questions or concerns that you may have regarding the information given to you following your procedure. If we do not reach you, we will leave a message.  However, if you are feeling well and you are not experiencing any problems, there is no need to return our call.  We will assume that you have returned to your regular daily activities without incident.  If any biopsies were taken you will be contacted by phone or by letter within the next 1-3 weeks.  Please call us at [phone] if you have not heard about the biopsies in 3 weeks.    SIGNATURES/CONFIDENTIALITY: You and/or your care partner have signed paperwork which will be entered into your electronic medical record.  These signatures attest to the fact that that the information above on your After Visit Summary has been reviewed and is understood.  Full responsibility of the confidentiality of this discharge information lies with you and/or your care-partner.  Await pathology  Please continue your normal medications   Please read over handouts about polyps, diverticulosis, hemorrhoids and high fiber diets 

## 2016-03-21 NOTE — Progress Notes (Signed)
Called to room to assist during endoscopic procedure.  Patient ID and intended procedure confirmed with present staff. Received instructions for my participation in the procedure from the performing physician.  

## 2016-03-21 NOTE — Progress Notes (Signed)
Report to PACU, RN, vss, BBS= Clear.  

## 2016-03-21 NOTE — Op Note (Signed)
[facility] Patient Name: [name] Procedure Date: 03/21/2016 8:26 AM MRN: [id number] Endoscopist: Milus Banister , MD Age: 66 Referring MD:  Date of Birth: 07/11/1950 Gender: Female Account #: [id number] Procedure:                Colonoscopy Indications:              1 year of Chronic diarrhea, Rectal bleeding:                            colonoscopy done in June 2015 by Dr. Anne Hahn                            in [facility] for complaints of rectal                            bleeding and an episode of severe diarrhea lasting                            5 days. She was found to have a first-degree                            internal hemorrhoids and an otherwise normal exam                            with no polyps or diverticulosis noted. Medicines:                Monitored Anesthesia Care Procedure:                Pre-Anesthesia Assessment:                           - Prior to the procedure, a History and Physical                            was performed, and patient medications and                            allergies were reviewed. The patient's tolerance of                            previous anesthesia was also reviewed. The risks                            and benefits of the procedure and the sedation                            options and risks were discussed with the patient.                            All questions were answered, and informed consent                            was obtained. Prior Anticoagulants: The patient has  taken no previous anticoagulant or antiplatelet                            agents. ASA Grade Assessment: III - A patient with                            severe systemic disease. After reviewing the risks                            and benefits, the patient was deemed in                            satisfactory condition to undergo the procedure.                           After obtaining  informed consent, the colonoscope                            was passed under direct vision. Throughout the                            procedure, the patient's blood pressure, pulse, and                            oxygen saturations were monitored continuously. The                            Model CF-HQ190L [id number]) scope was introduced                            through the anus and advanced to the the cecum,                            identified by appendiceal orifice and ileocecal                            valve. The colonoscopy was performed without                            difficulty. The patient tolerated the procedure                            well. The quality of the bowel preparation was                            good. The ileocecal valve, appendiceal orifice, and                            rectum were photographed. Scope In: 8:32:30 AM Scope Out: [id number] AM Scope Withdrawal Time: 0 hours 11 minutes 20 seconds  Total Procedure Duration: 0 hours 14 minutes 16 seconds  Findings:                 A 5 mm polyp was found in the ascending colon. The  polyp was sessile. The polyp was removed with a                            cold snare. Resection and retrieval were complete.                           The right colon was normal appearing, several                            biopsies were taken and sent to pathology.                           Multiple small and large-mouthed diverticula were                            found in the left colon. Erythema, edema,                            tortuosity was noted in the region. The erythema                            was mild, extended to the distal rectum. Biopsies                            were taken with a cold forceps for histology.                           External hemorrhoids were found. The hemorrhoids                            were medium-sized.                           The exam was otherwise without  abnormality on                            direct and retroflexion views. Complications:            No immediate complications. Estimated blood loss:                            None. Estimated Blood Loss:     Estimated blood loss: none. Impression:               - One 5 mm polyp in the ascending colon, removed                            with a cold snare. Resected and retrieved.                           - Moderate diverticulosis in the left colon,                            associated with erythema, edema, tortuousity. This  was biopsied.                           - External hemorrhoids.                           - The examination was otherwise normal on direct                            and retroflexion views. Recommendation:           - Patient has a contact number available for                            emergencies. The signs and symptoms of potential                            delayed complications were discussed with the                            patient. Return to normal activities tomorrow.                            Written discharge instructions were provided to the                            patient.                           - Resume previous diet.                           - Continue present medications.                           - Await final pathology from the colon biopsies.                            May recommend a trial of mesalamine pending the                            results.                           If the polyp(s) is proven to be 'pre-cancerous' on                            pathology, you will need repeat colonoscopy in 5                            years. If the polyp(s) is NOT 'precancerous' on                            pathology then you should repeat colon cancer                            screening  in 10 years with colonoscopy without need                            for colon cancer screening by any method prior to                             then (including stool testing). Milus Banister, MD 03/21/2016 8:54:43 AM This report has been signed electronically.

## 2016-03-22 ENCOUNTER — Telehealth: Payer: Self-pay | Admitting: *Deleted

## 2016-03-22 NOTE — Telephone Encounter (Signed)
  Follow up Call-  Call back number 03/21/2016  Post procedure Call Back phone  # [phone]  Permission to leave phone message Yes  Some recent data might be hidden     Patient questions:  Do you have a fever, pain , or abdominal swelling? No. Pain Score  0 *  Have you tolerated food without any problems? Yes.    Have you been able to return to your normal activities? Yes.    Do you have any questions about your discharge instructions: Diet   No. Medications  No. Follow up visit  No.  Do you have questions or concerns about your Care? No.  Actions: * If pain score is 4 or above: No action needed, pain <4.

## 2016-03-30 ENCOUNTER — Other Ambulatory Visit: Payer: Self-pay

## 2016-03-30 MED ORDER — MESALAMINE 1.2 G PO TBEC: 2.4000 g | DELAYED_RELEASE_TABLET | Freq: Every day | ORAL | 6 refills | Status: DC

## 2016-04-11 ENCOUNTER — Other Ambulatory Visit: Payer: Self-pay | Admitting: Internal Medicine

## 2016-04-11 DIAGNOSIS — N6489 Other specified disorders of breast: Secondary | ICD-10-CM

## 2016-05-04 ENCOUNTER — Emergency Department (HOSPITAL_COMMUNITY)
Admission: EM | Admit: 2016-05-04 | Discharge: 2016-05-04 | Disposition: A | Discharge status: Home / Self Care | Payer: Medicare Other | Attending: Emergency Medicine | Admitting: Emergency Medicine

## 2016-05-04 ENCOUNTER — Emergency Department (HOSPITAL_COMMUNITY): Payer: Medicare Other

## 2016-05-04 ENCOUNTER — Encounter (HOSPITAL_COMMUNITY): Payer: Self-pay | Admitting: Emergency Medicine

## 2016-05-04 DIAGNOSIS — Z79899 Other long term (current) drug therapy: Secondary | ICD-10-CM | POA: Diagnosis not present

## 2016-05-04 DIAGNOSIS — E119 Type 2 diabetes mellitus without complications: Secondary | ICD-10-CM | POA: Insufficient documentation

## 2016-05-04 DIAGNOSIS — R0789 Other chest pain: Secondary | ICD-10-CM | POA: Diagnosis not present

## 2016-05-04 DIAGNOSIS — I10 Essential (primary) hypertension: Secondary | ICD-10-CM | POA: Insufficient documentation

## 2016-05-04 DIAGNOSIS — M5441 Lumbago with sciatica, right side: Secondary | ICD-10-CM

## 2016-05-04 DIAGNOSIS — Z7984 Long term (current) use of oral hypoglycemic drugs: Secondary | ICD-10-CM | POA: Insufficient documentation

## 2016-05-04 DIAGNOSIS — R079 Chest pain, unspecified: Secondary | ICD-10-CM

## 2016-05-04 DIAGNOSIS — M545 Low back pain: Secondary | ICD-10-CM | POA: Diagnosis present

## 2016-05-04 LAB — CBC
HCT: 35.5 % — ABNORMAL LOW (ref 36.0–46.0)
HEMOGLOBIN: 11.2 g/dL — AB (ref 12.0–15.0)
MCH: 29.5 pg (ref 26.0–34.0)
MCHC: 31.5 g/dL (ref 30.0–36.0)
MCV: 93.4 fL (ref 78.0–100.0)
PLATELETS: 188 10*3/uL (ref 150–400)
RBC: 3.8 MIL/uL — AB (ref 3.87–5.11)
RDW: 13.6 % (ref 11.5–15.5)
WBC: 7.6 10*3/uL (ref 4.0–10.5)

## 2016-05-04 LAB — BASIC METABOLIC PANEL
ANION GAP: 7 (ref 5–15)
BUN: 22 mg/dL — ABNORMAL HIGH (ref 6–20)
CALCIUM: 9.3 mg/dL (ref 8.9–10.3)
CO2: 24 mmol/L (ref 22–32)
CREATININE: 0.65 mg/dL (ref 0.44–1.00)
Chloride: 111 mmol/L (ref 101–111)
GFR calc non Af Amer: >60 mL/min (ref >60)
Glucose, Bld: 194 mg/dL — ABNORMAL HIGH (ref 65–99)
Potassium: 4.5 mmol/L (ref 3.5–5.1)
SODIUM: 142 mmol/L (ref 135–145)

## 2016-05-04 LAB — I-STAT TROPONIN, ED: TROPONIN I, POC: 0 ng/mL (ref 0.00–0.08)

## 2016-05-04 MED ORDER — OXYCODONE-ACETAMINOPHEN 5-325 MG PO TABS: 1.0000 | ORAL_TABLET | ORAL | 0 refills | Status: DC | PRN

## 2016-05-04 MED ORDER — CYCLOBENZAPRINE HCL 10 MG PO TABS: 10.0000 mg | ORAL_TABLET | Freq: Two times a day (BID) | ORAL | 0 refills | Status: DC | PRN

## 2016-05-04 MED ORDER — HYDROMORPHONE HCL 1 MG/ML IJ SOLN
1.0000 mg | Freq: Once | INTRAMUSCULAR | Status: DC
  Filled 2016-05-04: qty 1

## 2016-05-04 MED ORDER — HYDROMORPHONE HCL 1 MG/ML IJ SOLN
1.0000 mg | Freq: Once | INTRAMUSCULAR | Status: AC
  Administered 2016-05-04: 1 mg via INTRAVENOUS

## 2016-05-04 MED ORDER — PREDNISONE 10 MG PO TABS: 20.0000 mg | ORAL_TABLET | Freq: Every day | ORAL | 0 refills | Status: DC

## 2016-05-04 MED ORDER — HYDROMORPHONE HCL 1 MG/ML IJ SOLN
1.0000 mg | Freq: Once | INTRAMUSCULAR | Status: AC
  Administered 2016-05-04: 1 mg via INTRAMUSCULAR
  Filled 2016-05-04: qty 1

## 2016-05-04 NOTE — ED Notes (Signed)
Patient transported to X-ray 

## 2016-05-04 NOTE — ED Triage Notes (Signed)
Patient complains of lower back pain x 2 weeks that radiates down to right knee. States affecting way she walks.

## 2016-05-04 NOTE — Discharge Instructions (Signed)
Medications for pain, muscle spasm, prednisone. Ice pack to lower back. Follow-up your primary care doctor. If symptoms do not improve, you may need an MRI of your lower spine.

## 2016-05-04 NOTE — ED Provider Notes (Signed)
[facility] DEPT Provider Note   CSN: [id number] Arrival date & time: 05/04/16  0707   By signing my name below, I, Hilbert Odor, attest that this documentation has been prepared under the direction and in the presence of Nat Christen, MD. Electronically Signed: Hilbert Odor, Scribe. 05/04/16. 8:17 AM. History   Chief Complaint Chief Complaint  Patient presents with  . Chest Pain    The history is provided by the patient. No language interpreter was used.  HPI Comments: [name] is a 66 y.o. female who presents to the Emergency Department complaining of lower back pain for the past 2 weeks. She states that the pain radiates down her right leg. Her pain is moderate. Her pain worsens upon positioning and movement. She has tried taking Naparsyn at home with minimal relief. She denies any known injury to back or legs. She also denies any bowel or bladder incontinence. She is ambulatory.   During patient's emergency department course, she complained of chest pain without dyspnea, diaphoresis, nausea. No previous history of cardiac disease. Past Medical History:  Diagnosis Date  . Anemia   . Anxiety   . DDD (degenerative disc disease), cervical   . DDD (degenerative disc disease), lumbar   . Depression   . Diabetes mellitus   . GERD (gastroesophageal reflux disease)   . Hypercholesteremia   . Hypertension     Patient Active Problem List   Diagnosis Date Noted  . Tachycardia 09/02/2012  . SIRS (systemic inflammatory response syndrome) ([place]) 09/02/2012  . Diarrhea 09/02/2012  . Obesity, Class III, BMI 40-49.9 (morbid obesity) ([place]) 09/02/2012  . Adjustment disorder with mixed anxiety and depressed mood 09/02/2012  . Diabetes type 2, controlled ([place]) 09/02/2012  . HTN (hypertension) 09/02/2012    Past Surgical History:  Procedure Laterality Date  . ANKLE SURGERY    . COLONOSCOPY    . DILATION AND CURETTAGE OF UTERUS    . KNEE SURGERY      OB History    Gravida Para Term Preterm AB Living   4 4 4     3    SAB TAB Ectopic Multiple Live Births                   Home Medications    Prior to Admission medications   Medication Sig Start Date End Date Taking? Authorizing Provider  acetaminophen (TYLENOL) 500 MG tablet Take 1,000 mg by mouth every 6 (six) hours as needed for moderate pain.   Yes Historical Provider, MD  buPROPion (WELLBUTRIN XL) 300 MG 24 hr tablet Take 300 mg by mouth daily.  03/01/16  Yes Historical Provider, MD  gabapentin (NEURONTIN) 300 MG capsule Take 300 mg by mouth 2 (two) times daily.  01/10/16  Yes Historical Provider, MD  glimepiride (AMARYL) 2 MG tablet Take 2 mg by mouth 2 (two) times daily.   Yes Historical Provider, MD  isosorbide mononitrate (IMDUR) 30 MG 24 hr tablet Take 30 mg by mouth every morning.   Yes Historical Provider, MD  JANUVIA 100 MG tablet Take 100 mg by mouth daily. 07/29/14  Yes Historical Provider, MD  lisinopril (PRINIVIL,ZESTRIL) 10 MG tablet Take 10 mg by mouth daily.   Yes Historical Provider, MD  mesalamine (LIALDA) 1.2 g EC tablet Take 2 tablets (2.4 g total) by mouth daily with breakfast. Patient taking differently: Take 2.4 g by mouth daily as needed (stomach).  03/30/16 05/04/16 Yes Milus Banister, MD  metFORMIN (GLUCOPHAGE) 1000 MG tablet Take 1,000 mg  by mouth 2 (two) times daily with a meal.   Yes Historical Provider, MD  metoprolol succinate (TOPROL-XL) 25 MG 24 hr tablet Take 25 mg by mouth every morning.    Yes Historical Provider, MD  naproxen (NAPROSYN) 500 MG tablet Take 1 tablet by mouth 2 (two) times daily as needed for pain. 05/01/16  Yes Historical Provider, MD  omeprazole (PRILOSEC) 20 MG capsule Take 20 mg by mouth every morning.   Yes Historical Provider, MD  simvastatin (ZOCOR) 20 MG tablet Take 20 mg by mouth at bedtime.   Yes Historical Provider, MD  cyclobenzaprine (FLEXERIL) 10 MG tablet Take 1 tablet (10 mg total) by mouth 2 (two) times daily as needed for muscle spasms.  05/04/16   Nat Christen, MD  oxyCODONE-acetaminophen (PERCOCET) 5-325 MG tablet Take 1-2 tablets by mouth every 4 (four) hours as needed. 05/04/16   Nat Christen, MD  predniSONE (DELTASONE) 10 MG tablet Take 2 tablets (20 mg total) by mouth daily. 05/04/16   Nat Christen, MD    Family History Family History  Problem Relation Age of Onset  . Colon cancer Mother   . Breast cancer Maternal Aunt   . Diabetes Maternal Aunt   . Prostate cancer Paternal Grandfather     Social History Social History  Substance Use Topics  . Smoking status: Never Smoker  . Smokeless tobacco: Never Used  . Alcohol use No     Allergies   Patient has no known allergies.   Review of Systems Review of Systems A complete 10 system review of systems was obtained and all systems are negative except as noted in the HPI and PMH.   Physical Exam Updated Vital Signs BP 118/65   Pulse 77   Temp 97.9 F (36.6 C) (Oral)   Resp 17   Ht 5' (1.524 m)   Wt 202 lb (91.6 kg)   SpO2 97%   BMI 39.45 kg/m   Physical Exam  Constitutional: She is oriented to person, place, and time. She appears well-developed and well-nourished. No distress.  Obese. No acute distress.  HENT:  Head: Normocephalic and atraumatic.  Eyes: Conjunctivae are normal.  Neck: Neck supple.  Cardiovascular: Normal rate and regular rhythm.   Pulmonary/Chest: Effort normal and breath sounds normal.  Abdominal: Soft. Bowel sounds are normal.  Musculoskeletal: Normal range of motion. She exhibits tenderness.  Tenderness to the right lower back into the distribution of the sciatic nerve. Pain with SLR on right.  Neurological: She is alert and oriented to person, place, and time.  Skin: Skin is warm and dry.  Psychiatric: She has a normal mood and affect. Her behavior is normal.  Nursing note and vitals reviewed.   ED Treatments / Results  DIAGNOSTIC STUDIES: Oxygen Saturation is 100% on RA, normal by my interpretation.    COORDINATION OF  CARE: 7:28 AM Discussed treatment plan with pt at bedside and pt agreed to plan. I will check the patient's lumbar x-ray and start her on pain management.  Labs (all labs ordered are listed, but only abnormal results are displayed) Labs Reviewed  BASIC METABOLIC PANEL - Abnormal; Notable for the following:       Result Value   Glucose, Bld 194 (*)    BUN 22 (*)    All other components within normal limits  CBC - Abnormal; Notable for the following:    RBC 3.80 (*)    Hemoglobin 11.2 (*)    HCT 35.5 (*)    All other  components within normal limits  I-STAT TROPOININ, ED    EKG  EKG Interpretation  Date/Time:  Friday May 04 2016 12:58:06 EDT Ventricular Rate:  67 PR Interval:    QRS Duration: 103 QT Interval:  389 QTC Calculation: 411 R Axis:   3 Text Interpretation:  Junctional rhythm Posterior infarct, old Confirmed by Alexandrya Chim  MD, Krosby Ritchie ([id number]) on 05/04/2016 1:14:38 PM Also confirmed by Lacinda Axon  MD, Tavionna Grout ([id number])  on 05/04/2016 2:27:57 PM Also confirmed by Lacinda Axon  MD, [place] ([id number])  on 05/04/2016 2:52:31 PM       Radiology Dg Chest 2 View  Result Date: 05/04/2016 CLINICAL DATA:  Central chest heaviness for 2-3 hours since being in the emergency department today, RIGHT low back pain radiating to RIGHT lower extremity, hypertension, diabetes mellitus, GERD EXAM: CHEST  2 VIEW COMPARISON:  08/01/2014 FINDINGS: Enlargement of cardiac silhouette. Mediastinal contours and pulmonary vascularity normal. Atelectasis versus scarring in the LEFT mid lung. Minimal subsegmental atelectasis at RIGHT base. No definite infiltrate, pleural effusion or pneumothorax. Scattered endplate spur formation thoracic spine. IMPRESSION: Minimal atelectasis RIGHT base with additional minimal atelectasis versus scarring in LEFT mid lung. Mild enlargement of cardiac silhouette. Electronically Signed   By: Lavonia Dana M.D.   On: 05/04/2016 14:07   Dg Lumbar Spine Complete  Result Date: 05/04/2016 CLINICAL DATA:   Lumbago radiating into right lower extremity EXAM: LUMBAR SPINE - COMPLETE 4+ VIEW COMPARISON:  September 07, 2011 FINDINGS: Frontal, lateral, spot lumbosacral lateral, and bilateral oblique views were obtained. There are 5 non-rib-bearing lumbar type vertebral bodies. T12 ribs are markedly hypoplastic. There is no fracture. There is 2 mm of anterolisthesis of L4 on L5, stable. No new spondylolisthesis. Disc spaces appear unremarkable. There are prominent anterior osteophytes at L4-L5. There is facet osteoarthritic change at L4-5 and L5-S1 bilaterally. There is aortic atherosclerosis. IMPRESSION: Areas of facet osteoarthritic change. Slight spondylolisthesis at L4-5, stable. No new spondylolisthesis. No fracture. There is aortic atherosclerosis. Electronically Signed   By: Lowella Grip III M.D.   On: 05/04/2016 08:48    Procedures Procedures (including critical care time)  Medications Ordered in ED Medications  HYDROmorphone (DILAUDID) injection 1 mg (1 mg Intramuscular Given 05/04/16 0815)  HYDROmorphone (DILAUDID) injection 1 mg (1 mg Intravenous Given 05/04/16 1555)     Initial Impression / Assessment and Plan / ED Course  I have reviewed the triage vital signs and the nursing notes.  Pertinent labs & imaging results that were available during my care of the patient were reviewed by me and considered in my medical decision making (see chart for details).       Final Clinical Impressions(s) / ED Diagnoses   Final diagnoses:  Acute right-sided low back pain with right-sided sciatica  Chest pain, unspecified type    New Prescriptions Discharge Medication List as of 05/04/2016  3:29 PM    START taking these medications   Details  cyclobenzaprine (FLEXERIL) 10 MG tablet Take 1 tablet (10 mg total) by mouth 2 (two) times daily as needed for muscle spasms., Starting Fri 05/04/2016, Print    oxyCODONE-acetaminophen (PERCOCET) 5-325 MG tablet Take 1-2 tablets by mouth every 4 (four) hours  as needed., Starting Fri 05/04/2016, Print    predniSONE (DELTASONE) 10 MG tablet Take 2 tablets (20 mg total) by mouth daily., Starting Fri 05/04/2016, Print       I personally performed the services described in this documentation, which was scribed in my presence. The recorded information has been reviewed and  is accurate.   I suspect patient has lumbar spine pathology and subsequent radicular pain down right leg. Plain films show no acute injury. Will discharge home with Flexeril 10 mg, Percocet, prednisone. She will follow-up with her primary care doctor. She understands that she may need an MRI. Screening tests for chest pain were negative.   Nat Christen, MD 05/05/16 [phone]

## 2016-05-30 ENCOUNTER — Ambulatory Visit
Admission: RE | Admit: 2016-05-30 | Discharge: 2016-05-30 | Disposition: A | Discharge status: Home / Self Care | Payer: Medicare Other | Source: Ambulatory Visit | Attending: Internal Medicine | Admitting: Internal Medicine

## 2016-05-30 DIAGNOSIS — N6489 Other specified disorders of breast: Secondary | ICD-10-CM

## 2017-05-29 ENCOUNTER — Other Ambulatory Visit: Payer: Self-pay | Admitting: Internal Medicine

## 2017-05-29 DIAGNOSIS — Z1231 Encounter for screening mammogram for malignant neoplasm of breast: Secondary | ICD-10-CM

## 2017-06-18 ENCOUNTER — Ambulatory Visit
Admission: RE | Admit: 2017-06-18 | Discharge: 2017-06-18 | Disposition: A | Discharge status: Home / Self Care | Payer: Medicare Other | Source: Ambulatory Visit | Attending: Internal Medicine | Admitting: Internal Medicine

## 2017-06-18 DIAGNOSIS — Z1231 Encounter for screening mammogram for malignant neoplasm of breast: Secondary | ICD-10-CM

## 2017-09-23 ENCOUNTER — Encounter (HOSPITAL_COMMUNITY): Payer: Self-pay | Admitting: Emergency Medicine

## 2017-09-23 ENCOUNTER — Emergency Department (HOSPITAL_COMMUNITY): Payer: Medicare Other

## 2017-09-23 ENCOUNTER — Observation Stay (HOSPITAL_COMMUNITY)
Admission: EM | Admit: 2017-09-23 | Discharge: 2017-09-24 | Disposition: A | Discharge status: Home / Self Care | Payer: Medicare Other | Attending: Internal Medicine | Admitting: Internal Medicine

## 2017-09-23 DIAGNOSIS — Z833 Family history of diabetes mellitus: Secondary | ICD-10-CM | POA: Diagnosis not present

## 2017-09-23 DIAGNOSIS — R079 Chest pain, unspecified: Secondary | ICD-10-CM | POA: Diagnosis present

## 2017-09-23 DIAGNOSIS — R52 Pain, unspecified: Secondary | ICD-10-CM

## 2017-09-23 DIAGNOSIS — I10 Essential (primary) hypertension: Secondary | ICD-10-CM | POA: Diagnosis not present

## 2017-09-23 DIAGNOSIS — E1142 Type 2 diabetes mellitus with diabetic polyneuropathy: Secondary | ICD-10-CM

## 2017-09-23 DIAGNOSIS — E785 Hyperlipidemia, unspecified: Secondary | ICD-10-CM | POA: Diagnosis not present

## 2017-09-23 DIAGNOSIS — E119 Type 2 diabetes mellitus without complications: Secondary | ICD-10-CM

## 2017-09-23 DIAGNOSIS — F4323 Adjustment disorder with mixed anxiety and depressed mood: Secondary | ICD-10-CM | POA: Diagnosis present

## 2017-09-23 DIAGNOSIS — K219 Gastro-esophageal reflux disease without esophagitis: Secondary | ICD-10-CM | POA: Diagnosis not present

## 2017-09-23 DIAGNOSIS — Z8249 Family history of ischemic heart disease and other diseases of the circulatory system: Secondary | ICD-10-CM | POA: Insufficient documentation

## 2017-09-23 DIAGNOSIS — E1140 Type 2 diabetes mellitus with diabetic neuropathy, unspecified: Secondary | ICD-10-CM | POA: Insufficient documentation

## 2017-09-23 DIAGNOSIS — Z7984 Long term (current) use of oral hypoglycemic drugs: Secondary | ICD-10-CM | POA: Diagnosis not present

## 2017-09-23 DIAGNOSIS — Z79899 Other long term (current) drug therapy: Secondary | ICD-10-CM | POA: Insufficient documentation

## 2017-09-23 DIAGNOSIS — Z9889 Other specified postprocedural states: Secondary | ICD-10-CM | POA: Diagnosis not present

## 2017-09-23 DIAGNOSIS — M5136 Other intervertebral disc degeneration, lumbar region: Secondary | ICD-10-CM | POA: Insufficient documentation

## 2017-09-23 DIAGNOSIS — R0789 Other chest pain: Principal | ICD-10-CM | POA: Insufficient documentation

## 2017-09-23 LAB — BASIC METABOLIC PANEL
ANION GAP: 9 (ref 5–15)
BUN: 14 mg/dL (ref 8–23)
CO2: 23 mmol/L (ref 22–32)
Calcium: 9.6 mg/dL (ref 8.9–10.3)
Chloride: 109 mmol/L (ref 98–111)
Creatinine, Ser: 0.77 mg/dL (ref 0.44–1.00)
GFR calc Af Amer: >60 mL/min (ref >60)
GLUCOSE: 134 mg/dL — AB (ref 70–99)
Potassium: 5 mmol/L (ref 3.5–5.1)
Sodium: 141 mmol/L (ref 135–145)

## 2017-09-23 LAB — CBC WITH DIFFERENTIAL/PLATELET
Abs Immature Granulocytes: 0 10*3/uL (ref 0.0–0.1)
BASOS ABS: 0 10*3/uL (ref 0.0–0.1)
Basophils Relative: 0 %
Eosinophils Absolute: 0.1 10*3/uL (ref 0.0–0.7)
Eosinophils Relative: 1 %
HCT: 36.1 % (ref 36.0–46.0)
Hemoglobin: 11.3 g/dL — ABNORMAL LOW (ref 12.0–15.0)
Immature Granulocytes: 0 %
Lymphocytes Relative: 41 %
Lymphs Abs: 3.1 10*3/uL (ref 0.7–4.0)
MCH: 29.4 pg (ref 26.0–34.0)
MCHC: 31.3 g/dL (ref 30.0–36.0)
MCV: 93.8 fL (ref 78.0–100.0)
MONO ABS: 0.6 10*3/uL (ref 0.1–1.0)
Monocytes Relative: 7 %
NEUTROS ABS: 3.7 10*3/uL (ref 1.7–7.7)
Neutrophils Relative %: 51 %
Platelets: 174 10*3/uL (ref 150–400)
RBC: 3.85 MIL/uL — ABNORMAL LOW (ref 3.87–5.11)
RDW: 13.5 % (ref 11.5–15.5)
WBC: 7.5 10*3/uL (ref 4.0–10.5)

## 2017-09-23 LAB — I-STAT CHEM 8, ED
BUN: 16 mg/dL (ref 8–23)
CALCIUM ION: 1.25 mmol/L (ref 1.15–1.40)
Chloride: 107 mmol/L (ref 98–111)
Creatinine, Ser: 0.7 mg/dL (ref 0.44–1.00)
Glucose, Bld: 129 mg/dL — ABNORMAL HIGH (ref 70–99)
HCT: 34 % — ABNORMAL LOW (ref 36.0–46.0)
Hemoglobin: 11.6 g/dL — ABNORMAL LOW (ref 12.0–15.0)
Potassium: 5 mmol/L (ref 3.5–5.1)
SODIUM: 141 mmol/L (ref 135–145)
TCO2: 22 mmol/L (ref 22–32)

## 2017-09-23 LAB — I-STAT TROPONIN, ED: TROPONIN I, POC: 0 ng/mL (ref 0.00–0.08)

## 2017-09-23 LAB — CBG MONITORING, ED
GLUCOSE-CAPILLARY: 120 mg/dL — AB (ref 70–99)
Glucose-Capillary: 100 mg/dL — ABNORMAL HIGH (ref 70–99)

## 2017-09-23 LAB — RAPID URINE DRUG SCREEN, HOSP PERFORMED
Amphetamines: NOT DETECTED
Barbiturates: NOT DETECTED
Benzodiazepines: NOT DETECTED
Cocaine: NOT DETECTED
Opiates: POSITIVE — AB
Tetrahydrocannabinol: NOT DETECTED

## 2017-09-23 LAB — HEMOGLOBIN A1C
HEMOGLOBIN A1C: 7.3 % — AB (ref 4.8–5.6)
MEAN PLASMA GLUCOSE: 162.81 mg/dL

## 2017-09-23 LAB — TSH: TSH: 1.111 u[IU]/mL (ref 0.350–4.500)

## 2017-09-23 LAB — TROPONIN I: Troponin I: <0.03 ng/mL (ref <0.03)

## 2017-09-23 MED ORDER — INSULIN ASPART 100 UNIT/ML ~~LOC~~ SOLN
0.0000 [IU] | Freq: Every day | SUBCUTANEOUS | Status: DC
  Administered 2017-09-23: 2 [IU] via SUBCUTANEOUS

## 2017-09-23 MED ORDER — PANTOPRAZOLE SODIUM 40 MG PO TBEC
40.0000 mg | DELAYED_RELEASE_TABLET | Freq: Every day | ORAL | Status: DC
  Administered 2017-09-24: 40 mg via ORAL
  Filled 2017-09-23: qty 1

## 2017-09-23 MED ORDER — ACETAMINOPHEN 325 MG PO TABS: 650.0000 mg | ORAL_TABLET | ORAL | Status: DC | PRN

## 2017-09-23 MED ORDER — METOPROLOL SUCCINATE ER 25 MG PO TB24
25.0000 mg | ORAL_TABLET | Freq: Two times a day (BID) | ORAL | Status: DC
  Administered 2017-09-23 – 2017-09-24 (×2): 25 mg via ORAL
  Filled 2017-09-23 (×4): qty 1

## 2017-09-23 MED ORDER — MORPHINE SULFATE (PF) 2 MG/ML IV SOLN
2.0000 mg | INTRAVENOUS | Status: DC | PRN
  Administered 2017-09-23: 2 mg via INTRAVENOUS
  Filled 2017-09-23: qty 1

## 2017-09-23 MED ORDER — ENOXAPARIN SODIUM 40 MG/0.4ML ~~LOC~~ SOLN
40.0000 mg | SUBCUTANEOUS | Status: DC
  Administered 2017-09-23: 40 mg via SUBCUTANEOUS
  Filled 2017-09-23 (×2): qty 0.4

## 2017-09-23 MED ORDER — SIMVASTATIN 20 MG PO TABS
20.0000 mg | ORAL_TABLET | Freq: Every day | ORAL | Status: DC
  Administered 2017-09-23: 20 mg via ORAL
  Filled 2017-09-23 (×2): qty 1

## 2017-09-23 MED ORDER — LISINOPRIL 10 MG PO TABS
10.0000 mg | ORAL_TABLET | Freq: Every day | ORAL | Status: DC
  Administered 2017-09-24: 10 mg via ORAL
  Filled 2017-09-23: qty 1

## 2017-09-23 MED ORDER — ASPIRIN EC 81 MG PO TBEC
81.0000 mg | DELAYED_RELEASE_TABLET | Freq: Every day | ORAL | Status: DC
  Administered 2017-09-24: 81 mg via ORAL
  Filled 2017-09-23: qty 1

## 2017-09-23 MED ORDER — ISOSORBIDE MONONITRATE ER 30 MG PO TB24
30.0000 mg | ORAL_TABLET | Freq: Two times a day (BID) | ORAL | Status: DC
  Administered 2017-09-23 – 2017-09-24 (×2): 30 mg via ORAL
  Filled 2017-09-23 (×2): qty 1

## 2017-09-23 MED ORDER — GABAPENTIN 300 MG PO CAPS
300.0000 mg | ORAL_CAPSULE | Freq: Two times a day (BID) | ORAL | Status: DC
  Administered 2017-09-23 – 2017-09-24 (×2): 300 mg via ORAL
  Filled 2017-09-23 (×2): qty 1

## 2017-09-23 MED ORDER — LORAZEPAM 2 MG/ML IJ SOLN: 0.5000 mg | INTRAMUSCULAR | Status: DC | PRN

## 2017-09-23 MED ORDER — ONDANSETRON HCL 4 MG/2ML IJ SOLN: 4.0000 mg | Freq: Four times a day (QID) | INTRAMUSCULAR | Status: DC | PRN

## 2017-09-23 MED ORDER — GI COCKTAIL ~~LOC~~: 30.0000 mL | Freq: Four times a day (QID) | ORAL | Status: DC | PRN

## 2017-09-23 MED ORDER — INSULIN ASPART 100 UNIT/ML ~~LOC~~ SOLN
0.0000 [IU] | Freq: Three times a day (TID) | SUBCUTANEOUS | Status: DC
  Administered 2017-09-24: 5 [IU] via SUBCUTANEOUS

## 2017-09-23 NOTE — ED Triage Notes (Addendum)
Patient arrived by [facility], from her doctor's office, related to chest pain. She reports left chest pain that radiates to her back(9 on a 0-10 scale). She describes the pain as "feeling tightness", also reports shortness of breath, 324mg  ASA given to Patient prior to arrival  -She adds that she had chest pain last week also.

## 2017-09-23 NOTE — ED Notes (Signed)
Patient is tearful this moment about leaving her 91yr/old grand daughter at home alone. She states that she is her primary care-giver

## 2017-09-23 NOTE — ED Notes (Signed)
Heart healthy carb modified dinner tray ordered 

## 2017-09-23 NOTE — ED Notes (Signed)
ED Provider at bedside. 

## 2017-09-23 NOTE — ED Notes (Signed)
Pt ambulate to bathroom 

## 2017-09-23 NOTE — H&P (Signed)
History and Physical    [name] JJO:[id number] DOB: 03/27/1950 DOA: 09/23/2017  PCP: Abran Richard, MD Consultants:  None Patient coming from:  Home - lives with granddaughter; NOK: Husband (separated), [phone]  Chief Complaint:  Chest pain  HPI: [name] is a 67 y.o. female with medical history significant of HTN; HLD; and DM presenting with chest pain.  "Dr. Yong Channel sent me.  I went to the doctor for a regular appointment with her."  While talking to the nurse, she asked about anxiety.  She reported having chest pain with anxiety; it radiates through to her back and she is never sure whether she needs to be seen for it.  She usually lays there and it resolves in 4-5 hours.  Today, it has been present and hasn't gone away.  She worries about the pain and about her granddaughter living with her - "I don't want her to see me having to be taken out or whatever"... So "I just endure it."  The pain started about 1030-11 this AM and ebbs and flows.  Up to 7/10, relaxed with deep breathing and it improved to 4/10 over 30 minutes.  EKG in the office was "not acute" but she decided that the patient should be evaluated here.  Nothing seems to make it worse, non-exertional.  Her last stress test was maybe 4-5 years ago; she did not require any intervention and "they said I had angina" and she has been taking Imdur for this.   ED Course:  Last week with left-sided CP.  Went to see her doctor today and developed it again.  Resolved spontaneously.  EKG ok.  Troponin 0.  No h/o CAD.  Review of Systems: As per HPI; otherwise review of systems reviewed and negative.   Ambulatory Status:  Ambulates without assistance  Past Medical History:  Diagnosis Date  . Anemia   . Anxiety   . DDD (degenerative disc disease), cervical   . DDD (degenerative disc disease), lumbar   . Depression   . Diabetes mellitus   . GERD (gastroesophageal reflux disease)   . Hypercholesteremia   . Hypertension      Past Surgical History:  Procedure Laterality Date  . ANKLE SURGERY    . COLONOSCOPY    . DILATION AND CURETTAGE OF UTERUS    . KNEE SURGERY      Social History   Socioeconomic History  . Marital status: Legally Separated    Spouse name: Not on file  . Number of children: Not on file  . Years of education: Not on file  . Highest education level: Not on file  Occupational History  . Occupation: retired  Scientific laboratory technician  . Financial resource strain: Not on file  . Food insecurity:    Worry: Not on file    Inability: Not on file  . Transportation needs:    Medical: Not on file    Non-medical: Not on file  Tobacco Use  . Smoking status: Never Smoker  . Smokeless tobacco: Never Used  Substance and Sexual Activity  . Alcohol use: No  . Drug use: No  . Sexual activity: Not on file  Lifestyle  . Physical activity:    Days per week: Not on file    Minutes per session: Not on file  . Stress: Not on file  Relationships  . Social connections:    Talks on phone: Not on file    Gets together: Not on file    Attends religious  service: Not on file    Active member of club or organization: Not on file    Attends meetings of clubs or organizations: Not on file    Relationship status: Not on file  . Intimate partner violence:    Fear of current or ex partner: Not on file    Emotionally abused: Not on file    Physically abused: Not on file    Forced sexual activity: Not on file  Other Topics Concern  . Not on file  Social History Narrative  . Not on file    No Known Allergies  Family History  Problem Relation Age of Onset  . Colon cancer Mother   . Breast cancer Maternal Aunt   . Diabetes Maternal Aunt   . Prostate cancer Paternal Grandfather   . CAD Sister 69       s/p 3v CABG    Prior to Admission medications   Medication Sig Start Date End Date Taking? Authorizing Provider  acetaminophen (TYLENOL) 500 MG tablet Take 1,000 mg by mouth every 6 (six) hours as  needed for moderate pain.    [provider]  buPROPion (WELLBUTRIN XL) 300 MG 24 hr tablet Take 300 mg by mouth daily.  03/01/16   [provider]  cyclobenzaprine (FLEXERIL) 10 MG tablet Take 1 tablet (10 mg total) by mouth 2 (two) times daily as needed for muscle spasms. 05/04/16   Nat Christen, MD  gabapentin (NEURONTIN) 300 MG capsule Take 300 mg by mouth 2 (two) times daily.  01/10/16   [provider]  glimepiride (AMARYL) 2 MG tablet Take 2 mg by mouth 2 (two) times daily.    [provider]  isosorbide mononitrate (IMDUR) 30 MG 24 hr tablet Take 30 mg by mouth every morning.    [provider]  JANUVIA 100 MG tablet Take 100 mg by mouth daily. 07/29/14   [provider]  lisinopril (PRINIVIL,ZESTRIL) 10 MG tablet Take 10 mg by mouth daily.    [provider]  mesalamine (LIALDA) 1.2 g EC tablet Take 2 tablets (2.4 g total) by mouth daily with breakfast. Patient taking differently: Take 2.4 g by mouth daily as needed (stomach).  03/30/16 05/04/16  Milus Banister, MD  metFORMIN (GLUCOPHAGE) 1000 MG tablet Take 1,000 mg by mouth 2 (two) times daily with a meal.    [provider]  metoprolol succinate (TOPROL-XL) 25 MG 24 hr tablet Take 25 mg by mouth every morning.     [provider]  naproxen (NAPROSYN) 500 MG tablet Take 1 tablet by mouth 2 (two) times daily as needed for pain. 05/01/16   [provider]  omeprazole (PRILOSEC) 20 MG capsule Take 20 mg by mouth every morning.    [provider]  oxyCODONE-acetaminophen (PERCOCET) 5-325 MG tablet Take 1-2 tablets by mouth every 4 (four) hours as needed. 05/04/16   Nat Christen, MD  predniSONE (DELTASONE) 10 MG tablet Take 2 tablets (20 mg total) by mouth daily. 05/04/16   Nat Christen, MD  simvastatin (ZOCOR) 20 MG tablet Take 20 mg by mouth at bedtime.    [provider]    Physical Exam: Vitals:   09/23/17 1417 09/23/17 1428 09/23/17 1429  09/23/17 1600  BP:    (!) 114/55  Pulse:  76 81 79  Resp:  17 12 13   Temp:      TempSrc:      SpO2: 99% 100% 100% 98%     General:  Appears  calm and comfortable and is NAD - but she is quite anxious Eyes:  PERRL, EOMI, normal lids, iris ENT:  grossly normal hearing, lips & tongue, mmm Neck:  no LAD, masses or thyromegaly; no carotid bruits Cardiovascular:  RRR, no m/r/g. No LE edema.  Respiratory:   CTA bilaterally with no wheezes/rales/rhonchi.  Normal respiratory effort. Abdomen:  soft, NT, ND, NABS Back:   normal alignment, no CVAT Skin:  no rash or induration seen on limited exam Musculoskeletal:  grossly normal tone BUE/BLE, good ROM, no bony abnormality Lower extremity:  No LE edema.  Limited foot exam with no ulcerations.  2+ distal pulses. Psychiatric:  Anxious/somewhat emotionally labile mood and affect, speech fluent and appropriate, AOx3 Neurologic:  CN 2-12 grossly intact, moves all extremities in coordinated fashion, sensation intact    Radiological Exams on Admission: Dg Chest Port 1 View  Result Date: 09/23/2017 CLINICAL DATA:  Left-sided chest pain radiating to her back. Shortness of breath. EXAM: PORTABLE CHEST 1 VIEW COMPARISON:  Chest x-ray dated May 04, 2016. FINDINGS: The heart size and mediastinal contours are within normal limits. Normal pulmonary vascularity. No focal consolidation, pleural effusion, or pneumothorax. No acute osseous abnormality. IMPRESSION: No active disease. Electronically Signed   By: Titus Dubin M.D.   On: 09/23/2017 14:50    EKG: Independently reviewed.  NSR with rate 78; nonspecific ST changes with no evidence of acute ischemia   Labs on Admission: I have personally reviewed the available labs and imaging studies at the time of the admission.  Pertinent labs:   Glucose 134 BMP otherwise WNL Troponin 0.00 Hgb 11.3, CBC otherwise essentially normal - stable   Assessment/Plan Principal Problem:   Chest pain Active  Problems:   Adjustment disorder with mixed anxiety and depressed mood   Diabetes type 2, controlled (HCC)   HTN (hypertension)   Dyslipidemia   Chest pain -Patient with left-sided chest pain that radiates into her back that has come on intermittently for weeks, appears to resolve spontaneously. -0-1/3 typical symptoms suggestive of noncardiac chest pain.  -CXR unremarkable.   -Initial cardiac troponin negative.  -EKG not indicative of acute ischemia.   -HEART pathway score is 5, >1% risk for ischemia -Will plan to place in observation status on telemetry to rule out ACS by overnight observation.  -cycle troponin q6h x 3 and repeat EKG in AM -Start ASA 81 mg  daily -morphine given -Risk factor stratification with HgbA1c and FLP; will also check TSH and UDS -Cardiology consultation in AM - NPO for possible stress test  -Will plan to start Heparin drip if enzymes are positive and/or chest pain recurs -She does not have a known h/o CAD but is taking Imdur  HTN -Takes Lisinopril and Toprol XL at home -Patient with good control while in the ER  HLD -Continue Zocor -No recent lipids in Epic, will add  DM -Check A1c -Hold Amaryl, Januvia, Glucophage -Cover with moderate-scale SSI for now  Adjustment d/o -Patient has significant life stressors - separated, her daughter is a drug addict, her grandson left her house and moved back in with his mother, her granddaughter is a teen living with her... -As we talked about her stressors, she became increasingly anxiety and emotionally labile and her pain worsened -While it is important to r/o CAD as the cause for her symptoms, anxiety/depression may also be playing a role -Will give IV Ativan as an adjunct for now    DVT prophylaxis:  Lovenox  Code Status:  Full -  confirmed with patient Family Communication:  None present Disposition Plan:  Home once clinically improved Consults called: Cardiology in AM - [employer] inbox message  sent Admission status: It is my clinical opinion that referral for OBSERVATION is reasonable and necessary in this patient based on the above information provided. The aforementioned taken together are felt to place the patient at high risk for further clinical deterioration. However it is anticipated that the patient may be medically stable for discharge from the hospital within 24 to 48 hours.    Karmen Bongo MD [facility]  If note is complete, please contact covering daytime or nighttime physician. [email or web address] Password [facility]  09/23/2017, 5:28 PM

## 2017-09-23 NOTE — ED Provider Notes (Signed)
[facility] EMERGENCY DEPARTMENT Provider Note   CSN: [id number] Arrival date & time: 09/23/17  1408     History   Chief Complaint Chief Complaint  Patient presents with  . Chest Pain    HPI [name] is a 67 y.o. female.  67 year old female with prior medical history as detailed below presents with complaint of chest pain.  Patient reports an episode of chest pain in the middle of last week.  She did not seek treatment for that.  Today she was at her doctor's office when she had another episode of chest discomfort.  She describes left-sided chest discomfort.  Her physician sent her to the ED for further evaluation.  Upon arrival to the ED she feels improved.  She was given aspirin prior to arrival.  She denies a specific history of CAD.  Her prior history is significant for diabetes, hypertension, and hyper cholesteremia.  The history is provided by the patient.  Chest Pain   This is a recurrent problem. The current episode started 1 to 2 hours ago. The problem occurs every several days. The problem has been resolved. The pain is present in the lateral region. The pain is mild. The quality of the pain is described as pressure-like. The pain does not radiate.    Past Medical History:  Diagnosis Date  . Anemia   . Anxiety   . DDD (degenerative disc disease), cervical   . DDD (degenerative disc disease), lumbar   . Depression   . Diabetes mellitus   . GERD (gastroesophageal reflux disease)   . Hypercholesteremia   . Hypertension     Patient Active Problem List   Diagnosis Date Noted  . Chest pain 09/23/2017  . Tachycardia 09/02/2012  . SIRS (systemic inflammatory response syndrome) ([place]) 09/02/2012  . Diarrhea 09/02/2012  . Obesity, Class III, BMI 40-49.9 (morbid obesity) ([place]) 09/02/2012  . Adjustment disorder with mixed anxiety and depressed mood 09/02/2012  . Diabetes type 2, controlled ([place]) 09/02/2012  . HTN (hypertension) 09/02/2012     Past Surgical History:  Procedure Laterality Date  . ANKLE SURGERY    . COLONOSCOPY    . DILATION AND CURETTAGE OF UTERUS    . KNEE SURGERY       OB History    Gravida  4   Para  4   Term  4   Preterm      AB      Living  3     SAB      TAB      Ectopic      Multiple      Live Births               Home Medications    Prior to Admission medications   Medication Sig Start Date End Date Taking? Authorizing Provider  gabapentin (NEURONTIN) 300 MG capsule Take 300 mg by mouth 2 (two) times daily.  01/10/16  Yes [provider]  glimepiride (AMARYL) 2 MG tablet Take 2 mg by mouth 2 (two) times daily.   Yes [provider]  isosorbide mononitrate (IMDUR) 30 MG 24 hr tablet Take 30 mg by mouth 2 (two) times daily.    Yes [provider]  JANUVIA 100 MG tablet Take 100 mg by mouth daily. 07/29/14  Yes [provider]  lisinopril (PRINIVIL,ZESTRIL) 10 MG tablet Take 10 mg by mouth daily.   Yes [provider]  metFORMIN (GLUCOPHAGE) 1000 MG tablet Take 1,000 mg  by mouth 2 (two) times daily with a meal.   Yes [provider]  metoprolol succinate (TOPROL-XL) 25 MG 24 hr tablet Take 25 mg by mouth 2 (two) times daily.    Yes [provider]  omeprazole (PRILOSEC) 20 MG capsule Take 20 mg by mouth every morning.   Yes [provider]  simvastatin (ZOCOR) 20 MG tablet Take 20 mg by mouth at bedtime.   Yes [provider]  acetaminophen (TYLENOL) 500 MG tablet Take 1,000 mg by mouth every 6 (six) hours as needed for moderate pain.    [provider]  cyclobenzaprine (FLEXERIL) 10 MG tablet Take 1 tablet (10 mg total) by mouth 2 (two) times daily as needed for muscle spasms. Patient not taking: Reported on 09/23/2017 05/04/16   Nat Christen, MD  mesalamine (LIALDA) 1.2 g EC tablet Take 2 tablets (2.4 g total) by mouth daily with breakfast. Patient taking differently: Take 2.4 g by mouth  daily as needed (stomach).  03/30/16 05/04/16  Milus Banister, MD  naproxen (NAPROSYN) 500 MG tablet Take 1 tablet by mouth 2 (two) times daily as needed for pain. 05/01/16   [provider]  oxyCODONE-acetaminophen (PERCOCET) 5-325 MG tablet Take 1-2 tablets by mouth every 4 (four) hours as needed. Patient not taking: Reported on 09/23/2017 05/04/16   Nat Christen, MD  predniSONE (DELTASONE) 10 MG tablet Take 2 tablets (20 mg total) by mouth daily. Patient not taking: Reported on 09/23/2017 05/04/16   Nat Christen, MD    Family History Family History  Problem Relation Age of Onset  . Colon cancer Mother   . Breast cancer Maternal Aunt   . Diabetes Maternal Aunt   . Prostate cancer Paternal Grandfather     Social History Social History   Tobacco Use  . Smoking status: Never Smoker  . Smokeless tobacco: Never Used  Substance Use Topics  . Alcohol use: No  . Drug use: No     Allergies   Patient has no known allergies.   Review of Systems Review of Systems  Cardiovascular: Positive for chest pain.  All other systems reviewed and are negative.    Physical Exam Updated Vital Signs BP 140/64 (BP Location: Left Arm)   Pulse 81   Temp 98.2 F (36.8 C) (Oral)   Resp 12   SpO2 100%   Physical Exam  Constitutional: She is oriented to person, place, and time. She appears well-developed and well-nourished. No distress.  HENT:  Head: Normocephalic and atraumatic.  Mouth/Throat: Oropharynx is clear and moist.  Eyes: Pupils are equal, round, and reactive to light. Conjunctivae and EOM are normal.  Neck: Normal range of motion. Neck supple.  Cardiovascular: Normal rate, regular rhythm and normal heart sounds.  Pulmonary/Chest: Effort normal and breath sounds normal. No respiratory distress.  Abdominal: Soft. She exhibits no distension. There is no tenderness.  Musculoskeletal: Normal range of motion. She exhibits no edema or deformity.  Neurological: She is alert and  oriented to person, place, and time.  Skin: Skin is warm and dry.  Psychiatric: She has a normal mood and affect.  Nursing note and vitals reviewed.    ED Treatments / Results  Labs (all labs ordered are listed, but only abnormal results are displayed) Labs Reviewed  BASIC METABOLIC PANEL - Abnormal; Notable for the following components:      Result Value   Glucose, Bld 134 (*)    All other components within normal limits  CBC WITH DIFFERENTIAL/PLATELET -  Abnormal; Notable for the following components:   RBC 3.85 (*)    Hemoglobin 11.3 (*)    All other components within normal limits  I-STAT CHEM 8, ED - Abnormal; Notable for the following components:   Glucose, Bld 129 (*)    Hemoglobin 11.6 (*)    HCT 34.0 (*)    All other components within normal limits  CBG MONITORING, ED - Abnormal; Notable for the following components:   Glucose-Capillary 120 (*)    All other components within normal limits  I-STAT TROPONIN, ED    EKG EKG Interpretation  Date/Time:  Monday September 23 2017 14:14:31 EDT Ventricular Rate:  78 PR Interval:    QRS Duration: 94 QT Interval:  343 QTC Calculation: 391 R Axis:   -17 Text Interpretation:  Sinus rhythm Borderline left axis deviation Low voltage, precordial leads Abnormal T, consider ischemia, lateral leads Confirmed by Dene Gentry [phone]) on 09/23/2017 2:31:10 PM   Radiology Dg Chest Port 1 View  Result Date: 09/23/2017 CLINICAL DATA:  Left-sided chest pain radiating to her back. Shortness of breath. EXAM: PORTABLE CHEST 1 VIEW COMPARISON:  Chest x-ray dated May 04, 2016. FINDINGS: The heart size and mediastinal contours are within normal limits. Normal pulmonary vascularity. No focal consolidation, pleural effusion, or pneumothorax. No acute osseous abnormality. IMPRESSION: No active disease. Electronically Signed   By: Titus Dubin M.D.   On: 09/23/2017 14:50    Procedures Procedures (including critical care time)  Medications  Ordered in ED Medications - No data to display   Initial Impression / Assessment and Plan / ED Course  I have reviewed the triage vital signs and the nursing notes.  Pertinent labs & imaging results that were available during my care of the patient were reviewed by me and considered in my medical decision making (see chart for details).     MDM  Screen complete  Patient is presenting for evaluation of chest pain.  Patient with multiple risk factors for CAD.  Initial EKG is without evidence of acute ischemia.  Initial troponin is negative.  Patient requires admission for ACS rule out.  Hospitalist service is aware of case will evaluate for admission.  Final Clinical Impressions(s) / ED Diagnoses   Final diagnoses:  Chest pain, unspecified type    ED Discharge Orders    None       Valarie Merino, MD 09/23/17 1556

## 2017-09-24 ENCOUNTER — Other Ambulatory Visit: Payer: Self-pay | Admitting: Cardiology

## 2017-09-24 ENCOUNTER — Encounter (HOSPITAL_COMMUNITY): Payer: Self-pay | Admitting: General Practice

## 2017-09-24 ENCOUNTER — Other Ambulatory Visit: Payer: Self-pay

## 2017-09-24 DIAGNOSIS — I200 Unstable angina: Secondary | ICD-10-CM

## 2017-09-24 DIAGNOSIS — E785 Hyperlipidemia, unspecified: Secondary | ICD-10-CM | POA: Diagnosis not present

## 2017-09-24 DIAGNOSIS — F4323 Adjustment disorder with mixed anxiety and depressed mood: Secondary | ICD-10-CM | POA: Diagnosis not present

## 2017-09-24 DIAGNOSIS — R079 Chest pain, unspecified: Secondary | ICD-10-CM | POA: Diagnosis not present

## 2017-09-24 DIAGNOSIS — E1140 Type 2 diabetes mellitus with diabetic neuropathy, unspecified: Secondary | ICD-10-CM | POA: Diagnosis not present

## 2017-09-24 DIAGNOSIS — I10 Essential (primary) hypertension: Secondary | ICD-10-CM

## 2017-09-24 DIAGNOSIS — R0789 Other chest pain: Secondary | ICD-10-CM | POA: Diagnosis not present

## 2017-09-24 DIAGNOSIS — E1142 Type 2 diabetes mellitus with diabetic polyneuropathy: Secondary | ICD-10-CM | POA: Diagnosis not present

## 2017-09-24 LAB — LIPID PANEL
CHOLESTEROL: 129 mg/dL (ref 0–200)
HDL: 37 mg/dL — AB (ref >40)
LDL CALC: 31 mg/dL (ref 0–99)
TRIGLYCERIDES: 305 mg/dL — AB (ref <150)
Total CHOL/HDL Ratio: 3.5 RATIO
VLDL: 61 mg/dL — AB (ref 0–40)

## 2017-09-24 LAB — TROPONIN I: Troponin I: <0.03 ng/mL (ref <0.03)

## 2017-09-24 LAB — HIV ANTIBODY (ROUTINE TESTING W REFLEX): HIV Screen 4th Generation wRfx: NONREACTIVE

## 2017-09-24 LAB — GLUCOSE, CAPILLARY
Glucose-Capillary: 244 mg/dL — ABNORMAL HIGH (ref 70–99)
Glucose-Capillary: 200 mg/dL — ABNORMAL HIGH (ref 70–99)
Glucose-Capillary: 205 mg/dL — ABNORMAL HIGH (ref 70–99)

## 2017-09-24 MED ORDER — METOPROLOL TARTRATE 50 MG PO TABS: 50.0000 mg | ORAL_TABLET | Freq: Once | ORAL | 0 refills | Status: DC

## 2017-09-24 NOTE — Consult Note (Signed)
Cardiology Consult    Patient ID: [name] MRN: [id number], DOB/AGE: 09/24/1950   Admit date: 09/23/2017 Date of Consult: 09/24/2017  Primary Physician: Abran Richard, MD Primary Cardiologist: New Requesting Provider: Dr. Eliseo Squires Reason for Consultation: Chest pain  [name] is a 67 y.o. female who is being seen today for the evaluation of chest pain at the request of Dr. Eliseo Squires.   Patient Profile    67 yo female with PMH of anxiety, HTN, HL, DM and GERD who presented with chest pain from PCP office.   Past Medical History   Past Medical History:  Diagnosis Date  . Anemia   . Anxiety   . DDD (degenerative disc disease), cervical   . DDD (degenerative disc disease), lumbar   . Depression   . Diabetes mellitus   . GERD (gastroesophageal reflux disease)   . Hypercholesteremia   . Hypertension     Past Surgical History:  Procedure Laterality Date  . ANKLE SURGERY    . COLONOSCOPY    . DILATION AND CURETTAGE OF UTERUS    . KNEE SURGERY       Allergies  No Known Allergies  History of Present Illness    [name] is a 67 yo female with PMH of anxiety, HTN, HL, DM and GERD. Non-smoker. She reports having had a stress test in [place] back about 4-5 years ago when she had a similar episode of chest pain which was normal. She is followed regularly by her PCP. States she has been under increased stress in her personal life recently. Has a daughter who is a drug addict and granddaughter/grandson who are living with her. States about a week ago she had an episode of chest pain that woke her from sleep. Started in her chest and radiate through to her back. This lasted a couple hours and then subsided. She did not seek medical treatment at that time. Went to her PCP yesterday and wanted to discuss going back on her antidepressant medications. While in the office talking about this she developed centralized chest pain that radiated through to her back. EKG per her  report was ok, but her PCP recommended that she come to the ED for further evaluation.   In the ED her labs showed stable electrolytes, Trop neg x3, Hgb 11.6, LDL 31, Hgb A1c 7.3. EKG showed SR with LVH. CXR was negative. She was admitted for further work up. No further chest pain since admission.   Inpatient Medications    . aspirin EC  81 mg Oral Daily  . enoxaparin (LOVENOX) injection  40 mg Subcutaneous Q24H  . gabapentin  300 mg Oral BID  . insulin aspart  0-15 Units Subcutaneous TID WC  . insulin aspart  0-5 Units Subcutaneous QHS  . isosorbide mononitrate  30 mg Oral BID  . lisinopril  10 mg Oral Daily  . metoprolol succinate  25 mg Oral BID  . pantoprazole  40 mg Oral Daily  . simvastatin  20 mg Oral QHS    Family History    Family History  Problem Relation Age of Onset  . Colon cancer Mother   . Breast cancer Maternal Aunt   . Diabetes Maternal Aunt   . Prostate cancer Paternal Grandfather   . CAD Sister 67       s/p 43v CABG    Social History    Social History   Socioeconomic History  . Marital status: Legally Separated    Spouse name:  Not on file  . Number of children: Not on file  . Years of education: Not on file  . Highest education level: Not on file  Occupational History  . Occupation: retired  Scientific laboratory technician  . Financial resource strain: Not on file  . Food insecurity:    Worry: Not on file    Inability: Not on file  . Transportation needs:    Medical: Not on file    Non-medical: Not on file  Tobacco Use  . Smoking status: Never Smoker  . Smokeless tobacco: Never Used  Substance and Sexual Activity  . Alcohol use: No  . Drug use: No  . Sexual activity: Not on file  Lifestyle  . Physical activity:    Days per week: Not on file    Minutes per session: Not on file  . Stress: Not on file  Relationships  . Social connections:    Talks on phone: Not on file    Gets together: Not on file    Attends religious service: Not on file    Active  member of club or organization: Not on file    Attends meetings of clubs or organizations: Not on file    Relationship status: Not on file  . Intimate partner violence:    Fear of current or ex partner: Not on file    Emotionally abused: Not on file    Physically abused: Not on file    Forced sexual activity: Not on file  Other Topics Concern  . Not on file  Social History Narrative  . Not on file     Review of Systems    See HPI  All other systems reviewed and are otherwise negative except as noted above.  Physical Exam    Blood pressure 110/60, pulse 79, temperature 98.9 F (37.2 C), temperature source Oral, resp. rate 16, height 5' (1.524 m), weight 89.4 kg, SpO2 97 %.  General: Pleasant, obese, older WF, NAD Psych: Normal affect. Neuro: Alert and oriented X 3. Moves all extremities spontaneously. HEENT: Normal  Neck: Supple without bruits or JVD. Lungs:  Resp regular and unlabored, CTA. Heart: RRR no s3, s4, or murmurs. Abdomen: Soft, non-tender, non-distended, BS + x 4.  Extremities: No clubbing, cyanosis or edema. DP/PT/Radials 2+ and equal bilaterally.  Labs    Troponin [facility] of Care Test) Recent Labs    09/23/17 1446  TROPIPOC 0.00   Recent Labs    09/23/17 1925 09/23/17 2306 09/24/17 0440  TROPONINI <0.03 <0.03 <0.03   Lab Results  Component Value Date   WBC 7.5 09/23/2017   HGB 11.6 (L) 09/23/2017   HCT 34.0 (L) 09/23/2017   MCV 93.8 09/23/2017   PLT 174 09/23/2017    Recent Labs  Lab 09/23/17 1436 09/23/17 1448  NA 141 141  K 5.0 5.0  CL 109 107  CO2 23  --   BUN 14 16  CREATININE 0.77 0.70  CALCIUM 9.6  --   GLUCOSE 134* 129*   Lab Results  Component Value Date   CHOL 129 09/24/2017   HDL 37 (L) 09/24/2017   LDLCALC 31 09/24/2017   TRIG 305 (H) 09/24/2017   No results found for: [facility]   Radiology Studies    Dg Chest Port 1 View  Result Date: 09/23/2017 CLINICAL DATA:  Left-sided chest pain radiating to her back.  Shortness of breath. EXAM: PORTABLE CHEST 1 VIEW COMPARISON:  Chest x-ray dated May 04, 2016. FINDINGS: The heart size and mediastinal contours  are within normal limits. Normal pulmonary vascularity. No focal consolidation, pleural effusion, or pneumothorax. No acute osseous abnormality. IMPRESSION: No active disease. Electronically Signed   By: Titus Dubin M.D.   On: 09/23/2017 14:50    ECG & Cardiac Imaging    EKG:  The EKG was personally reviewed and demonstrates SR with LVH   Assessment & Plan    67 yo female with PMH of anxiety, HTN, HL, DM and GERD who presented with chest pain from PCP office.   1. Atypical chest pain: has been having episodes intermittently for quite some time. These last for several hours at a time. Had one while at PCP office yesterday and sent to the ED. Trops neg x3. EKG without ischemia. No further chest pain since admission. She does have CRFs with HTN, HL, DM and family hx, though symptoms seem to be more so related to her stress and anxiety at home. Will plan for outpatient coronary CT with follow up afterwards. Will send in Rx for one time dose of metoprolol 50mg  for day of CT.  2. HTN: stable with current therapy.  3. HL: on statin  4. Anxiety: being followed by her PCP for this, as well as depression.   Barnet Pall, NP-C Pager [phone] 09/24/2017, 12:44 PM

## 2017-09-24 NOTE — Discharge Summary (Signed)
Physician Discharge Summary  [name] FUX:[id number] DOB: 03-21-1950 DOA: 09/23/2017  PCP: Abran Richard, MD  Admit date: 09/23/2017 Discharge date: 09/24/2017  Admitted From: home Discharge disposition: home   Recommendations for Outpatient Follow-Up:   1. Referral for counseling/stress management 2. outpateint CTA with cardiology for evaluation of coronaries   Discharge Diagnosis:   Principal Problem:   Chest pain Active Problems:   Adjustment disorder with mixed anxiety and depressed mood   Diabetes type 2, controlled (HCC)   HTN (hypertension)   Dyslipidemia    Discharge Condition: Improved.  Diet recommendation: Low sodium, heart healthy.  Carbohydrate-modified.  Wound care: None.  Code status: Full.   History of Present Illness:   [name] is a 67 y.o. female with medical history significant of HTN; HLD; and DM presenting with chest pain.  "Dr. Yong Channel sent me.  I went to the doctor for a regular appointment with her."  While talking to the nurse, she asked about anxiety.  She reported having chest pain with anxiety; it radiates through to her back and she is never sure whether she needs to be seen for it.  She usually lays there and it resolves in 4-5 hours.  Today, it has been present and hasn't gone away.  She worries about the pain and about her granddaughter living with her - "I don't want her to see me having to be taken out or whatever"... So "I just endure it."  The pain started about 1030-11 this AM and ebbs and flows.  Up to 7/10, relaxed with deep breathing and it improved to 4/10 over 30 minutes.  EKG in the office was "not acute" but she decided that the patient should be evaluated here.  Nothing seems to make it worse, non-exertional.  Her last stress test was maybe 4-5 years ago; she did not require any intervention and "they said I had angina" and she has been taking Imdur for this.   Hospital Course by Problem:   Per cards:66  yo WF with history of obesity, DM with neuropathy, HTN, HLD and family history of CAD is seen for evaluation of chest pain. She reports 2 recent episodes of chest pan one week ago and yesterday. Pain begins as a sharp stabbing pain in the left breast area then migrates to the central chest and back and described as tight and fist like. Did not try anything for relief. Last week pain lasted 2-3 hours. Similar pain similar to what she had 4-5 years ago and was evaluated in [place] with negative stress test. Now pain free.   On exam she is normotensive. HR is NSR No JVD or bruits Lungs are clear. CV RRR no gallop, murmur, rub. Abd soft nontender.  Pulses 2+= no edema  Ecg shows NSR with LVH. I have personally reviewed and interpreted this study. Troponin negative x 3  Impression: Chest pain with intermediate risk for cardiac etiology. Patient has multiple coronary risk factors.  We discussed evaluation including stress testing, coronary CTA or cardiac cath. She is unable to walk on a treadmill due to neuropathy. She has normal renal function. I would favor doing a coronary CTA given its sensitivity and negative predictive value. She will need to be beta blocked prior to study. This can be arranged as an outpatient.     Medical Consultants:   cardiology   Discharge Exam:   Vitals:   09/24/17 1052 09/24/17 1228  BP: (!) 143/64 110/60  Pulse:  79 79  Resp:  16  Temp:  98.9 F (37.2 C)  SpO2:  97%   Vitals:   09/24/17 0214 09/24/17 0420 09/24/17 1052 09/24/17 1228  BP:  (!) 117/55 (!) 143/64 110/60  Pulse:  79 79 79  Resp:  18  16  Temp:  97.7 F (36.5 C)  98.9 F (37.2 C)  TempSrc:  Oral  Oral  SpO2:  99%  97%  Weight: 89.4 kg     Height:        General exam: tearful at times regarding her stress at home  The results of significant diagnostics from this hospitalization (including imaging, microbiology, ancillary and laboratory) are listed below for reference.      Procedures and Diagnostic Studies:   Dg Chest Port 1 View  Result Date: 09/23/2017 CLINICAL DATA:  Left-sided chest pain radiating to her back. Shortness of breath. EXAM: PORTABLE CHEST 1 VIEW COMPARISON:  Chest x-ray dated May 04, 2016. FINDINGS: The heart size and mediastinal contours are within normal limits. Normal pulmonary vascularity. No focal consolidation, pleural effusion, or pneumothorax. No acute osseous abnormality. IMPRESSION: No active disease. Electronically Signed   By: Titus Dubin M.D.   On: 09/23/2017 14:50     Labs:   Basic Metabolic Panel: Recent Labs  Lab 09/23/17 1436 09/23/17 1448  NA 141 141  K 5.0 5.0  CL 109 107  CO2 23  --   GLUCOSE 134* 129*  BUN 14 16  CREATININE 0.77 0.70  CALCIUM 9.6  --    GFR Estimated Creatinine Clearance: 68.9 mL/min (by C-G formula based on SCr of 0.7 mg/dL). Liver Function Tests: No results for input(s): AST, ALT, ALKPHOS, BILITOT, PROT, ALBUMIN in the last 168 hours. No results for input(s): LIPASE, AMYLASE in the last 168 hours. No results for input(s): AMMONIA in the last 168 hours. Coagulation profile No results for input(s): INR, PROTIME in the last 168 hours.  CBC: Recent Labs  Lab 09/23/17 1436 09/23/17 1448  WBC 7.5  --   NEUTROABS 3.7  --   HGB 11.3* 11.6*  HCT 36.1 34.0*  MCV 93.8  --   PLT 174  --    Cardiac Enzymes: Recent Labs  Lab 09/23/17 1925 09/23/17 2306 09/24/17 0440  TROPONINI <0.03 <0.03 <0.03   BNP: Invalid input(s): POCBNP CBG: Recent Labs  Lab 09/23/17 1450 09/23/17 1817 09/23/17 2207 09/24/17 0806 09/24/17 1225  GLUCAP 120* 100* 244* 200* 205*   D-Dimer No results for input(s): DDIMER in the last 72 hours. Hgb A1c Recent Labs    09/23/17 2025  HGBA1C 7.3*   Lipid Profile Recent Labs    09/24/17 0440  CHOL 129  HDL 37*  LDLCALC 31  TRIG 305*  CHOLHDL 3.5   Thyroid function studies Recent Labs    09/23/17 1733  TSH 1.111   Anemia work  up No results for input(s): VITAMINB12, FOLATE, FERRITIN, TIBC, IRON, RETICCTPCT in the last 72 hours. Microbiology No results found for this or any previous visit (from the past 240 hour(s)).   Discharge Instructions:   Discharge Instructions    Diet - low sodium heart healthy   Complete by:  As directed    Diet Carb Modified   Complete by:  As directed    Increase activity slowly   Complete by:  As directed      Allergies as of 09/24/2017   No Known Allergies     Medication List    TAKE these medications  acetaminophen 500 MG tablet Commonly known as:  TYLENOL Take 1,000 mg by mouth every 6 (six) hours as needed for moderate pain.   gabapentin 300 MG capsule Commonly known as:  NEURONTIN Take 300 mg by mouth 2 (two) times daily.   glimepiride 2 MG tablet Commonly known as:  AMARYL Take 2 mg by mouth 2 (two) times daily.   isosorbide mononitrate 30 MG 24 hr tablet Commonly known as:  IMDUR Take 30 mg by mouth 2 (two) times daily.   JANUVIA 100 MG tablet Generic drug:  sitaGLIPtin Take 100 mg by mouth daily.   lisinopril 10 MG tablet Commonly known as:  PRINIVIL,ZESTRIL Take 10 mg by mouth daily.   metFORMIN 1000 MG tablet Commonly known as:  GLUCOPHAGE Take 1,000 mg by mouth 2 (two) times daily with a meal.   metoprolol succinate 25 MG 24 hr tablet Commonly known as:  TOPROL-XL Take 25 mg by mouth 2 (two) times daily.   metoprolol tartrate 50 MG tablet Commonly known as:  LOPRESSOR Take 1 tablet (50 mg total) by mouth once for 1 dose. Please take this tablet on the day of your coronary CT scan 2 hours prior to the scan.   omeprazole 20 MG capsule Commonly known as:  PRILOSEC Take 20 mg by mouth every morning.   simvastatin 20 MG tablet Commonly known as:  ZOCOR Take 20 mg by mouth at bedtime.      Follow-up Information    Abran Richard, MD Follow up in 1 week(s).   Specialty:  Internal Medicine Contact information: [address] [place] [address] [phone]        cardiology will arrange CTA Follow up.            Time coordinating discharge: 25 min  Signed:  Geradine Girt  [facility] 09/24/2017, 1:55 PM

## 2017-09-24 NOTE — Progress Notes (Signed)
Inpatient Diabetes Program Recommendations  AACE/ADA: New Consensus Statement on Inpatient Glycemic Control (2015)  Target Ranges:  Prepandial:   less than 140 mg/dL      Peak postprandial:   less than 180 mg/dL (1-2 hours)      Critically ill patients:  140 - 180 mg/dL   Lab Results  Component Value Date   GLUCAP 200 (H) 09/24/2017   HGBA1C 7.3 (H) 09/23/2017    Review of Glycemic ControlResults for FREDNA, STRICKER (MRN [id number]) as of 09/24/2017 10:17  Ref. Range 09/23/2017 14:50 09/23/2017 18:17 09/23/2017 22:07 09/24/2017 08:06  Glucose-Capillary Latest Ref Range: 70 - 99 mg/dL 120 (H) 100 (H) 244 (H) 200 (H)    Diabetes history: Type 2 DM  Outpatient Diabetes medications: Amaryl 2 mg bid, Januvia 100 mg daily, Metformin 1000 mg bid Current orders for Inpatient glycemic control:  Novolog moderate tid with meals and HS  Inpatient Diabetes Program Recommendations:   While in the hospital (and oral diabetes held), consider adding Lantus 15 units daily. Based on A1C, patient will not need insulin at home.  Thanks,  Adah Perl, RN, BC-ADM Inpatient Diabetes Coordinator Pager [phone] (8a-5p)

## 2017-09-24 NOTE — Clinical Social Work Note (Signed)
Clinical Social Work Assessment  Patient Details  Name: [name] MRN: [id number] Date of Birth: 08-Sep-1950  Date of referral:  09/24/17               Reason for consult:  Transportation                Permission sought to share information with:    Permission granted to share information::  No  Name::        Agency::     Relationship::     Contact Information:     Housing/Transportation Living arrangements for the past 2 months:  Apartment Source of Information:  Patient, Medical Team Patient Interpreter Needed:  None Criminal Activity/Legal Involvement Pertinent to Current Situation/Hospitalization:  No - Comment as needed Significant Relationships:  Friend, Other Family Members(Granddaughter) Lives with:  Other (Comment)(18 year old granddaughter) Do you feel safe going back to the place where you live?  Yes Need for family participation in patient care:  Yes (Comment)  Care giving concerns:  Transportation home.   Social Worker assessment / plan:  CSW met with patient. No supports at bedside. CSW introduced role and inquired about transportation needs. Patient stated her car is at [facility] and she just needs a ride there and then she feels comfortable driving home. Patient unable to afford cab (only $13 left in food stamps) and has no one that can pick her up. She has a friend listed in emergency contacts but she lives in [place], [place]. Discussed case with Surveyor, quantity of social work who approved the cab voucher but wanted CSW to complete social determinants screen and provide resources. [facility] community resources provided for emergency assistance. Patient confirmed she often has food insecurity. She gets only a little over $100 in food stamps monthly for her and her granddaughter. She has a car but states it is sometimes broken down. In those instances she is able to use Medicaid transportation to get to medical appointments. No further  concerns. CSW signing off as social work intervention is no longer needed.   Employment status:  Unemployed Forensic scientist:  Medicare, Medicaid In [place] PT Recommendations:  Not assessed at this time Information / Referral to community resources:  Other (Comment Required)(Community resources, cab voucher.)  Patient/Family's Response to care:  Patient agreeable to cab voucher, social determinants screening, and receiving resources. Patient's granddaughter and friend supportive and involved in patient's care. Patient appreciated social work intervention.  Patient/Family's Understanding of and Emotional Response to Diagnosis, Current Treatment, and Prognosis:  Patient has a good understanding of the reason for admission and social work consult. Patient appears happy with hospital care.  Emotional Assessment Appearance:  Appears stated age Attitude/Demeanor/Rapport:  Engaged, Gracious Affect (typically observed):  Accepting, Appropriate, Calm, Pleasant Orientation:  Oriented to Self, Oriented to Place, Oriented to  Time, Oriented to Situation Alcohol / Substance use:  Never Used Psych involvement (Current and /or in the community):  No (Comment)  Discharge Needs  Concerns to be addressed:  Care Coordination Readmission within the last 30 days:  No Current discharge risk:  None Barriers to Discharge:  No Barriers Identified   Candie Chroman, LCSW 09/24/2017, 2:38 PM

## 2017-09-24 NOTE — Progress Notes (Signed)
Pt has been NPO for cardiology consult, pt inquiring when she would know plans, called [facility] cardiology master and will have consult expedited as pt is npo and diabetic

## 2017-09-24 NOTE — Progress Notes (Signed)
Await cardiology recommendations.  CE negative.  Lots of stress at home.  Chest wall pain with palpation-- will leave NPO in case stress test recommended Eulogio Bear DO

## 2017-09-24 NOTE — Progress Notes (Signed)
Pt ready for dc, called [employer] for ride to [facility] where her care is located, voucher given to pt, pt taken to entrance for pickup via wheelchair

## 2017-09-24 NOTE — Progress Notes (Signed)
Reviewed AVS discharge instructions with patient/caregiver. Patient/caregiver verbalizes understanding of instructions received. AVS and prescriptions received by patient/caregiver. If present, telemetry box removed and central cardiac monitoring department notified of discharge. Peripheral IV removed, site benign with tip intact.  

## 2017-10-23 ENCOUNTER — Encounter (HOSPITAL_COMMUNITY): Payer: Self-pay

## 2017-10-23 ENCOUNTER — Ambulatory Visit (HOSPITAL_COMMUNITY)
Admission: RE | Admit: 2017-10-23 | Discharge: 2017-10-23 | Disposition: A | Discharge status: Home / Self Care | Payer: Medicare Other | Source: Ambulatory Visit | Attending: Cardiology | Admitting: Cardiology

## 2017-10-23 DIAGNOSIS — I200 Unstable angina: Secondary | ICD-10-CM | POA: Diagnosis not present

## 2017-10-23 DIAGNOSIS — Z5309 Procedure and treatment not carried out because of other contraindication: Secondary | ICD-10-CM | POA: Diagnosis not present

## 2017-10-23 MED ORDER — NITROGLYCERIN 0.4 MG SL SUBL
SUBLINGUAL_TABLET | SUBLINGUAL | Status: AC
  Filled 2017-10-23: qty 2

## 2017-10-23 MED ORDER — DILTIAZEM HCL 25 MG/5ML IV SOLN
5.0000 mg | Freq: Once | INTRAVENOUS | Status: DC
  Filled 2017-10-23: qty 5

## 2017-10-23 MED ORDER — NITROGLYCERIN 0.4 MG SL SUBL
0.8000 mg | SUBLINGUAL_TABLET | Freq: Once | SUBLINGUAL | Status: DC
  Filled 2017-10-23: qty 25

## 2017-10-23 MED ORDER — DILTIAZEM HCL 25 MG/5ML IV SOLN
10.0000 mg | Freq: Once | INTRAVENOUS | Status: AC
  Administered 2017-10-23: 10 mg via INTRAVENOUS
  Filled 2017-10-23: qty 5

## 2017-10-23 MED ORDER — METOPROLOL TARTRATE 5 MG/5ML IV SOLN
5.0000 mg | INTRAVENOUS | Status: DC | PRN
  Administered 2017-10-23 (×3): 5 mg via INTRAVENOUS
  Filled 2017-10-23: qty 5

## 2017-10-23 MED ORDER — METOPROLOL TARTRATE 5 MG/5ML IV SOLN
INTRAVENOUS | Status: AC
  Filled 2017-10-23: qty 10

## 2017-10-23 MED ORDER — METOPROLOL TARTRATE 5 MG/5ML IV SOLN
INTRAVENOUS | Status: AC
  Filled 2017-10-23: qty 20

## 2017-10-23 MED ORDER — METOPROLOL TARTRATE 5 MG/5ML IV SOLN
5.0000 mg | INTRAVENOUS | Status: DC | PRN
  Administered 2017-10-23: 5 mg via INTRAVENOUS
  Filled 2017-10-23: qty 5

## 2017-10-23 MED ORDER — DILTIAZEM HCL 25 MG/5ML IV SOLN
5.0000 mg | Freq: Once | INTRAVENOUS | Status: AC
  Administered 2017-10-23: 5 mg via INTRAVENOUS
  Filled 2017-10-23: qty 5

## 2017-10-23 MED ORDER — DILTIAZEM HCL 25 MG/5ML IV SOLN
INTRAVENOUS | Status: AC
  Filled 2017-10-23: qty 5

## 2017-10-23 NOTE — Progress Notes (Signed)
Patient came in for CT heart with resting HR around 78. Patient took 50 mg PO metoprolol 1 hour before coming in for test. This Rn has given patient 25 mg IV metoprolol and 20 of diltiazem IV. Patient current HR 74 with BP 139/68. Aundra Dubin, MD notified. Test canceled for patient due to HR

## 2018-05-13 ENCOUNTER — Other Ambulatory Visit: Payer: Self-pay | Admitting: Internal Medicine

## 2018-05-13 DIAGNOSIS — Z1231 Encounter for screening mammogram for malignant neoplasm of breast: Secondary | ICD-10-CM

## 2018-07-15 ENCOUNTER — Ambulatory Visit
Admission: RE | Admit: 2018-07-15 | Discharge: 2018-07-15 | Disposition: A | Discharge status: Home / Self Care | Payer: Medicare Other | Source: Ambulatory Visit | Attending: Internal Medicine | Admitting: Internal Medicine

## 2018-07-15 ENCOUNTER — Other Ambulatory Visit: Payer: Self-pay

## 2018-07-15 DIAGNOSIS — Z1231 Encounter for screening mammogram for malignant neoplasm of breast: Secondary | ICD-10-CM

## 2018-10-27 ENCOUNTER — Emergency Department (HOSPITAL_COMMUNITY)
Admission: EM | Admit: 2018-10-27 | Discharge: 2018-10-27 | Disposition: A | Discharge status: Home / Self Care | Payer: Medicare Other | Attending: Emergency Medicine | Admitting: Emergency Medicine

## 2018-10-27 ENCOUNTER — Emergency Department (HOSPITAL_COMMUNITY): Payer: Medicare Other

## 2018-10-27 ENCOUNTER — Other Ambulatory Visit: Payer: Self-pay

## 2018-10-27 ENCOUNTER — Encounter (HOSPITAL_COMMUNITY): Payer: Self-pay

## 2018-10-27 DIAGNOSIS — I10 Essential (primary) hypertension: Secondary | ICD-10-CM | POA: Insufficient documentation

## 2018-10-27 DIAGNOSIS — Z79899 Other long term (current) drug therapy: Secondary | ICD-10-CM | POA: Insufficient documentation

## 2018-10-27 DIAGNOSIS — E119 Type 2 diabetes mellitus without complications: Secondary | ICD-10-CM | POA: Insufficient documentation

## 2018-10-27 DIAGNOSIS — N390 Urinary tract infection, site not specified: Secondary | ICD-10-CM

## 2018-10-27 DIAGNOSIS — N200 Calculus of kidney: Secondary | ICD-10-CM | POA: Diagnosis not present

## 2018-10-27 DIAGNOSIS — Z7984 Long term (current) use of oral hypoglycemic drugs: Secondary | ICD-10-CM | POA: Diagnosis not present

## 2018-10-27 DIAGNOSIS — R1031 Right lower quadrant pain: Secondary | ICD-10-CM | POA: Diagnosis present

## 2018-10-27 LAB — URINALYSIS, ROUTINE W REFLEX MICROSCOPIC
Bilirubin Urine: NEGATIVE
Glucose, UA: >=500 mg/dL — AB
Ketones, ur: 20 mg/dL — AB
Nitrite: NEGATIVE
Protein, ur: 30 mg/dL — AB
RBC / HPF: >50 RBC/hpf — ABNORMAL HIGH (ref 0–5)
Specific Gravity, Urine: 1.025 (ref 1.005–1.030)
pH: 5 (ref 5.0–8.0)

## 2018-10-27 LAB — COMPREHENSIVE METABOLIC PANEL
ALT: 25 U/L (ref 0–44)
AST: 44 U/L — ABNORMAL HIGH (ref 15–41)
Albumin: 3.6 g/dL (ref 3.5–5.0)
Alkaline Phosphatase: 50 U/L (ref 38–126)
Anion gap: 12 (ref 5–15)
BUN: 11 mg/dL (ref 8–23)
CO2: 22 mmol/L (ref 22–32)
Calcium: 9 mg/dL (ref 8.9–10.3)
Chloride: 107 mmol/L (ref 98–111)
Creatinine, Ser: 0.83 mg/dL (ref 0.44–1.00)
GFR calc Af Amer: >60 mL/min (ref >60)
GFR calc non Af Amer: >60 mL/min (ref >60)
Glucose, Bld: 298 mg/dL — ABNORMAL HIGH (ref 70–99)
Potassium: 4.6 mmol/L (ref 3.5–5.1)
Sodium: 141 mmol/L (ref 135–145)
Total Bilirubin: 0.4 mg/dL (ref 0.3–1.2)
Total Protein: 7.6 g/dL (ref 6.5–8.1)

## 2018-10-27 LAB — LIPASE, BLOOD: Lipase: 47 U/L (ref 11–51)

## 2018-10-27 LAB — CBC
HCT: 36 % (ref 36.0–46.0)
Hemoglobin: 11 g/dL — ABNORMAL LOW (ref 12.0–15.0)
MCH: 29.6 pg (ref 26.0–34.0)
MCHC: 30.6 g/dL (ref 30.0–36.0)
MCV: 97 fL (ref 80.0–100.0)
Platelets: 165 10*3/uL (ref 150–400)
RBC: 3.71 MIL/uL — ABNORMAL LOW (ref 3.87–5.11)
RDW: 13.6 % (ref 11.5–15.5)
WBC: 5.7 10*3/uL (ref 4.0–10.5)
nRBC: 0 % (ref 0.0–0.2)

## 2018-10-27 MED ORDER — TAMSULOSIN HCL 0.4 MG PO CAPS: 0.4000 mg | ORAL_CAPSULE | Freq: Every day | ORAL | 0 refills | Status: AC

## 2018-10-27 MED ORDER — ONDANSETRON HCL 4 MG/2ML IJ SOLN
4.0000 mg | Freq: Once | INTRAMUSCULAR | Status: AC
  Administered 2018-10-27: 4 mg via INTRAVENOUS
  Filled 2018-10-27: qty 2

## 2018-10-27 MED ORDER — ONDANSETRON HCL 4 MG PO TABS: 4.0000 mg | ORAL_TABLET | Freq: Four times a day (QID) | ORAL | 0 refills | Status: AC

## 2018-10-27 MED ORDER — KETOROLAC TROMETHAMINE 30 MG/ML IJ SOLN
15.0000 mg | Freq: Once | INTRAMUSCULAR | Status: AC
  Administered 2018-10-27: 15:00:00 15 mg via INTRAVENOUS
  Filled 2018-10-27: qty 1

## 2018-10-27 MED ORDER — TAMSULOSIN HCL 0.4 MG PO CAPS
0.4000 mg | ORAL_CAPSULE | Freq: Once | ORAL | Status: AC
  Administered 2018-10-27: 0.4 mg via ORAL
  Filled 2018-10-27: qty 1

## 2018-10-27 MED ORDER — SODIUM CHLORIDE 0.9% FLUSH
3.0000 mL | Freq: Once | INTRAVENOUS | Status: AC
  Administered 2018-10-27: 3 mL via INTRAVENOUS

## 2018-10-27 MED ORDER — CEFTRIAXONE SODIUM 1 G IJ SOLR
1.0000 g | Freq: Once | INTRAMUSCULAR | Status: AC
  Administered 2018-10-27: 1 g via INTRAMUSCULAR
  Filled 2018-10-27: qty 10

## 2018-10-27 MED ORDER — LIDOCAINE HCL (PF) 1 % IJ SOLN
INTRAMUSCULAR | Status: AC
  Administered 2018-10-27: 17:00:00
  Filled 2018-10-27: qty 2

## 2018-10-27 NOTE — ED Provider Notes (Signed)
[facility] EMERGENCY DEPARTMENT Provider Note   CSN: [id number] Arrival date & time: 10/27/18  1210     History   Chief Complaint Chief Complaint  Patient presents with  . Abdominal Pain    HPI [name] is a 68 y.o. female.     Patient is a 68 year old female with past medical history of diabetes, depression, hypertension presenting to the emergency department for abdominal pain.  Patient reports that this is been going on for a couple of weeks now.  Reports that she was having some hematuria and lower abdominal pain so her primary care doctor put her on ciprofloxacin and Flagyl.  Reports that she normally has chronic diarrhea but has since had constipation.  Denies any fever, chills, vomiting.  She does endorse nausea and she does endorse pain in the right flank area.  Denies any history of kidney stones.  Denies any vaginal bleeding or vaginal discharge.     Past Medical History:  Diagnosis Date  . Anemia   . Anxiety   . DDD (degenerative disc disease), cervical   . DDD (degenerative disc disease), lumbar   . Depression   . Diabetes mellitus   . GERD (gastroesophageal reflux disease)   . Hypercholesteremia   . Hypertension     Patient Active Problem List   Diagnosis Date Noted  . Chest pain 09/23/2017  . Dyslipidemia 09/23/2017  . Tachycardia 09/02/2012  . SIRS (systemic inflammatory response syndrome) ([place]) 09/02/2012  . Diarrhea 09/02/2012  . Obesity, Class III, BMI 40-49.9 (morbid obesity) ([place]) 09/02/2012  . Adjustment disorder with mixed anxiety and depressed mood 09/02/2012  . Diabetes type 2, controlled ([place]) 09/02/2012  . HTN (hypertension) 09/02/2012    Past Surgical History:  Procedure Laterality Date  . ANKLE SURGERY    . COLONOSCOPY    . DILATION AND CURETTAGE OF UTERUS    . KNEE SURGERY       OB History    Gravida  4   Para  4   Term  4   Preterm      AB      Living  3     SAB      TAB      Ectopic      Multiple      Live Births               Home Medications    Prior to Admission medications   Medication Sig Start Date End Date Taking? Authorizing Provider  acetaminophen (TYLENOL) 500 MG tablet Take 1,000 mg by mouth every 6 (six) hours as needed for moderate pain.    [provider]  gabapentin (NEURONTIN) 300 MG capsule Take 300 mg by mouth 2 (two) times daily.  01/10/16   [provider]  glimepiride (AMARYL) 2 MG tablet Take 2 mg by mouth 2 (two) times daily.    [provider]  isosorbide mononitrate (IMDUR) 30 MG 24 hr tablet Take 30 mg by mouth 2 (two) times daily.     [provider]  JANUVIA 100 MG tablet Take 100 mg by mouth daily. 07/29/14   [provider]  lisinopril (PRINIVIL,ZESTRIL) 10 MG tablet Take 10 mg by mouth daily.    [provider]  metFORMIN (GLUCOPHAGE) 1000 MG tablet Take 1,000 mg by mouth 2 (two) times daily with a meal.    [provider]  metoprolol succinate (TOPROL-XL) 25 MG 24 hr tablet Take 25 mg by mouth 2 (two) times  daily.     [provider]  metoprolol tartrate (LOPRESSOR) 50 MG tablet Take 1 tablet (50 mg total) by mouth once for 1 dose. Please take this tablet on the day of your coronary CT scan 2 hours prior to the scan. 09/24/17 09/24/17  Cheryln Manly, NP  omeprazole (PRILOSEC) 20 MG capsule Take 20 mg by mouth every morning.    [provider]  simvastatin (ZOCOR) 20 MG tablet Take 20 mg by mouth at bedtime.    [provider]    Family History Family History  Problem Relation Age of Onset  . Colon cancer Mother   . Breast cancer Maternal Aunt   . Diabetes Maternal Aunt   . Prostate cancer Paternal Grandfather   . CAD Sister 58       s/p 18v CABG    Social History Social History   Tobacco Use  . Smoking status: Never Smoker  . Smokeless tobacco: Never Used  Substance Use Topics  . Alcohol use: No  . Drug use: No     Allergies   Patient has no  known allergies.   Review of Systems Review of Systems  Constitutional: Negative for chills and fever.  HENT: Negative for ear pain and sore throat.   Eyes: Negative for pain and visual disturbance.  Respiratory: Negative for cough and shortness of breath.   Cardiovascular: Negative for chest pain and palpitations.  Gastrointestinal: Positive for abdominal pain, constipation and nausea. Negative for diarrhea and vomiting.  Genitourinary: Positive for dysuria, flank pain and hematuria. Negative for decreased urine volume, frequency, pelvic pain, vaginal bleeding, vaginal discharge and vaginal pain.  Musculoskeletal: Negative for arthralgias and back pain.  Skin: Negative for color change and rash.  Neurological: Negative for dizziness and syncope.  All other systems reviewed and are negative.    Physical Exam Updated Vital Signs BP (!) 148/83 (BP Location: Right Arm)   Pulse 91   Temp 98.4 F (36.9 C) (Oral)   Resp 15   Ht 5' (1.524 m)   Wt 83.9 kg   SpO2 98%   BMI 36.13 kg/m   Physical Exam Vitals signs and nursing note reviewed.  Constitutional:      Appearance: Normal appearance.  HENT:     Head: Normocephalic.     Mouth/Throat:     Mouth: Mucous membranes are moist.  Eyes:     Conjunctiva/sclera: Conjunctivae normal.  Cardiovascular:     Rate and Rhythm: Normal rate and regular rhythm.     Heart sounds: Normal heart sounds.  Pulmonary:     Effort: Pulmonary effort is normal.  Abdominal:     General: Abdomen is flat. Bowel sounds are normal. There is no distension.     Palpations: Abdomen is soft. There is no mass.     Tenderness: There is abdominal tenderness in the suprapubic area. There is right CVA tenderness. There is no left CVA tenderness, guarding or rebound. Negative signs include Murphy's sign and Rovsing's sign.  Skin:    General: Skin is dry.  Neurological:     Mental Status: She is alert.  Psychiatric:        Mood and Affect: Mood normal.       ED Treatments / Results  Labs (all labs ordered are listed, but only abnormal results are displayed) Labs Reviewed  COMPREHENSIVE METABOLIC PANEL - Abnormal; Notable for the following components:      Result Value   Glucose, Bld 298 (*)  AST 44 (*)    All other components within normal limits  CBC - Abnormal; Notable for the following components:   RBC 3.71 (*)    Hemoglobin 11.0 (*)    All other components within normal limits  URINALYSIS, ROUTINE W REFLEX MICROSCOPIC - Abnormal; Notable for the following components:   APPearance HAZY (*)    Glucose, UA >=500 (*)    Hgb urine dipstick LARGE (*)    Ketones, ur 20 (*)    Protein, ur 30 (*)    Leukocytes,Ua TRACE (*)    RBC / HPF >50 (*)    Bacteria, UA RARE (*)    All other components within normal limits  URINE CULTURE  LIPASE, BLOOD    EKG None  Radiology No results found.  Procedures Procedures (including critical care time)  Medications Ordered in ED Medications  sodium chloride flush (NS) 0.9 % injection 3 mL (3 mLs Intravenous Given 10/27/18 1451)  ketorolac (TORADOL) 30 MG/ML injection 15 mg (15 mg Intravenous Given 10/27/18 1453)  ondansetron (ZOFRAN) injection 4 mg (4 mg Intravenous Given 10/27/18 1456)     Initial Impression / Assessment and Plan / ED Course  I have reviewed the triage vital signs and the nursing notes.  Pertinent labs & imaging results that were available during my care of the patient were reviewed by me and considered in my medical decision making (see chart for details).  Clinical Course as of Oct 26 1636  Mon Oct 27, 2018  1553 Patient has a large R side kidney stone in the renal pelvis w/o hydronephrosis. UA significant for probable UTI. She is not septic. She has been taking cipro and flagyl. I will culture her urine. Discussed with Dr. Roderic Palau and plan to consult urology on ? Change in abx and follow up.   [KM]  84 Spoke with urology on call. They recommend f/u in the office.  Stop flagyl but continue cipro. Patient in agreement with the plan   [KM]    Clinical Course User Index [KM] Alveria Apley, PA-C         Final Clinical Impressions(s) / ED Diagnoses   Final diagnoses:  None    ED Discharge Orders    None       Kristine Royal 10/27/18 1638    Virgel Manifold, MD 10/28/18 [phone]

## 2018-10-27 NOTE — Discharge Instructions (Addendum)
Follow up ASAP with urology. If you have worsening symptoms or are unable to urinate you need to return to the ER.  Continue taking your ciprofloxacin.  Stop taking Flagyl.  Start taking Flomax every day and take Zofran if you are feeling nauseous.

## 2018-10-27 NOTE — ED Triage Notes (Signed)
Pt is having back pain and lower abdominal pain. Was put on Flagyl and Cipro for a bladder infection on Friday. Pain is still not resolved.

## 2018-10-28 LAB — URINE CULTURE
Culture: NO GROWTH
Special Requests: NORMAL

## 2018-10-29 ENCOUNTER — Other Ambulatory Visit: Payer: Self-pay | Admitting: Urology

## 2018-10-29 ENCOUNTER — Ambulatory Visit (INDEPENDENT_AMBULATORY_CARE_PROVIDER_SITE_OTHER): Payer: Medicare Other | Admitting: Urology

## 2018-10-29 ENCOUNTER — Encounter (HOSPITAL_COMMUNITY): Payer: Self-pay | Admitting: General Practice

## 2018-10-29 DIAGNOSIS — N200 Calculus of kidney: Secondary | ICD-10-CM

## 2018-10-30 ENCOUNTER — Encounter (HOSPITAL_COMMUNITY): Payer: Self-pay | Admitting: *Deleted

## 2018-10-30 ENCOUNTER — Ambulatory Visit (HOSPITAL_COMMUNITY)
Admission: RE | Admit: 2018-10-30 | Discharge: 2018-10-30 | Disposition: A | Discharge status: Home / Self Care | Payer: Medicare Other | Attending: Urology | Admitting: Urology

## 2018-10-30 ENCOUNTER — Other Ambulatory Visit: Payer: Self-pay

## 2018-10-30 ENCOUNTER — Ambulatory Visit (HOSPITAL_COMMUNITY): Payer: Medicare Other

## 2018-10-30 ENCOUNTER — Encounter (HOSPITAL_COMMUNITY)
Admission: RE | Disposition: A | Discharge status: Home / Self Care | Payer: Self-pay | Source: Home / Self Care | Attending: Urology

## 2018-10-30 DIAGNOSIS — E669 Obesity, unspecified: Secondary | ICD-10-CM | POA: Diagnosis not present

## 2018-10-30 DIAGNOSIS — Z7984 Long term (current) use of oral hypoglycemic drugs: Secondary | ICD-10-CM | POA: Insufficient documentation

## 2018-10-30 DIAGNOSIS — Z888 Allergy status to other drugs, medicaments and biological substances status: Secondary | ICD-10-CM | POA: Insufficient documentation

## 2018-10-30 DIAGNOSIS — N200 Calculus of kidney: Secondary | ICD-10-CM | POA: Insufficient documentation

## 2018-10-30 DIAGNOSIS — Z6836 Body mass index (BMI) 36.0-36.9, adult: Secondary | ICD-10-CM | POA: Diagnosis not present

## 2018-10-30 DIAGNOSIS — I10 Essential (primary) hypertension: Secondary | ICD-10-CM | POA: Insufficient documentation

## 2018-10-30 DIAGNOSIS — E119 Type 2 diabetes mellitus without complications: Secondary | ICD-10-CM | POA: Insufficient documentation

## 2018-10-30 DIAGNOSIS — Z79899 Other long term (current) drug therapy: Secondary | ICD-10-CM | POA: Insufficient documentation

## 2018-10-30 HISTORY — DX: Personal history of urinary calculi: Z87.442

## 2018-10-30 HISTORY — PX: EXTRACORPOREAL SHOCK WAVE LITHOTRIPSY: SHX1557

## 2018-10-30 HISTORY — DX: Dyspnea, unspecified: R06.00

## 2018-10-30 LAB — GLUCOSE, CAPILLARY: Glucose-Capillary: 211 mg/dL — ABNORMAL HIGH (ref 70–99)

## 2018-10-30 SURGERY — LITHOTRIPSY, ESWL
Anesthesia: LOCAL | Laterality: Right

## 2018-10-30 MED ORDER — CIPROFLOXACIN HCL 500 MG PO TABS: 500.0000 mg | ORAL_TABLET | ORAL | Status: DC

## 2018-10-30 MED ORDER — DIPHENHYDRAMINE HCL 25 MG PO CAPS
25.0000 mg | ORAL_CAPSULE | ORAL | Status: AC
  Administered 2018-10-30: 25 mg via ORAL
  Filled 2018-10-30: qty 1

## 2018-10-30 MED ORDER — HYDROCODONE-ACETAMINOPHEN 5-325 MG PO TABS: 1.0000 | ORAL_TABLET | Freq: Four times a day (QID) | ORAL | 0 refills | Status: AC | PRN

## 2018-10-30 MED ORDER — SODIUM CHLORIDE 0.9 % IV SOLN
INTRAVENOUS | Status: DC
  Administered 2018-10-30: 15:00:00 via INTRAVENOUS

## 2018-10-30 MED ORDER — DIAZEPAM 5 MG PO TABS
10.0000 mg | ORAL_TABLET | ORAL | Status: AC
  Administered 2018-10-30: 10 mg via ORAL
  Filled 2018-10-30: qty 2

## 2018-10-30 NOTE — Discharge Instructions (Signed)

## 2018-10-31 ENCOUNTER — Encounter (HOSPITAL_COMMUNITY): Payer: Self-pay | Admitting: Urology

## 2018-11-11 ENCOUNTER — Other Ambulatory Visit (HOSPITAL_COMMUNITY): Payer: Self-pay | Admitting: Urology

## 2018-11-11 ENCOUNTER — Ambulatory Visit (HOSPITAL_COMMUNITY)
Admission: RE | Admit: 2018-11-11 | Discharge: 2018-11-11 | Disposition: A | Discharge status: Home / Self Care | Payer: Medicare Other | Source: Ambulatory Visit | Attending: Urology | Admitting: Urology

## 2018-11-11 ENCOUNTER — Other Ambulatory Visit: Payer: Self-pay

## 2018-11-11 DIAGNOSIS — N200 Calculus of kidney: Secondary | ICD-10-CM | POA: Diagnosis present

## 2018-11-12 ENCOUNTER — Other Ambulatory Visit (HOSPITAL_COMMUNITY)
Admission: RE | Admit: 2018-11-12 | Discharge: 2018-11-12 | Disposition: A | Discharge status: Home / Self Care | Payer: Medicare Other | Source: Ambulatory Visit | Attending: Urology | Admitting: Urology

## 2018-11-12 ENCOUNTER — Ambulatory Visit (INDEPENDENT_AMBULATORY_CARE_PROVIDER_SITE_OTHER): Payer: Medicare Other | Admitting: Urology

## 2018-11-12 DIAGNOSIS — N200 Calculus of kidney: Secondary | ICD-10-CM

## 2018-11-20 LAB — CALCULI, WITH PHOTOGRAPH (CLINICAL LAB)
Calcium Oxalate Monohydrate: 10 %
Uric Acid Calculi: 90 %
Weight Calculi: 410 mg

## 2018-12-03 ENCOUNTER — Ambulatory Visit: Payer: Medicare Other | Admitting: Urology

## 2019-06-19 ENCOUNTER — Other Ambulatory Visit: Payer: Self-pay | Admitting: Internal Medicine

## 2019-06-19 DIAGNOSIS — Z1231 Encounter for screening mammogram for malignant neoplasm of breast: Secondary | ICD-10-CM

## 2019-07-16 ENCOUNTER — Other Ambulatory Visit: Payer: Self-pay

## 2019-07-16 ENCOUNTER — Ambulatory Visit
Admission: RE | Admit: 2019-07-16 | Discharge: 2019-07-16 | Disposition: A | Discharge status: Home / Self Care | Payer: Medicare (Managed Care) | Source: Ambulatory Visit | Attending: Internal Medicine | Admitting: Internal Medicine

## 2019-07-16 DIAGNOSIS — Z1231 Encounter for screening mammogram for malignant neoplasm of breast: Secondary | ICD-10-CM

## 2020-06-09 ENCOUNTER — Other Ambulatory Visit: Payer: Self-pay | Admitting: Internal Medicine

## 2020-06-09 DIAGNOSIS — Z1231 Encounter for screening mammogram for malignant neoplasm of breast: Secondary | ICD-10-CM

## 2020-08-03 ENCOUNTER — Other Ambulatory Visit: Payer: Self-pay

## 2020-08-03 ENCOUNTER — Ambulatory Visit
Admission: RE | Admit: 2020-08-03 | Discharge: 2020-08-03 | Disposition: A | Discharge status: Home / Self Care | Payer: Medicare (Managed Care) | Source: Ambulatory Visit | Attending: Internal Medicine | Admitting: Internal Medicine

## 2020-08-03 DIAGNOSIS — Z1231 Encounter for screening mammogram for malignant neoplasm of breast: Secondary | ICD-10-CM

## 2020-12-12 IMAGING — CR DG ABDOMEN 1V
1 series · 1 of 1 positions shown · non-contrast
Comparison: CT scan of October 27, 2018.

CLINICAL DATA: Preop for right renal stone.

EXAM:
ABDOMEN - 1 VIEW

[t abdomen supine]
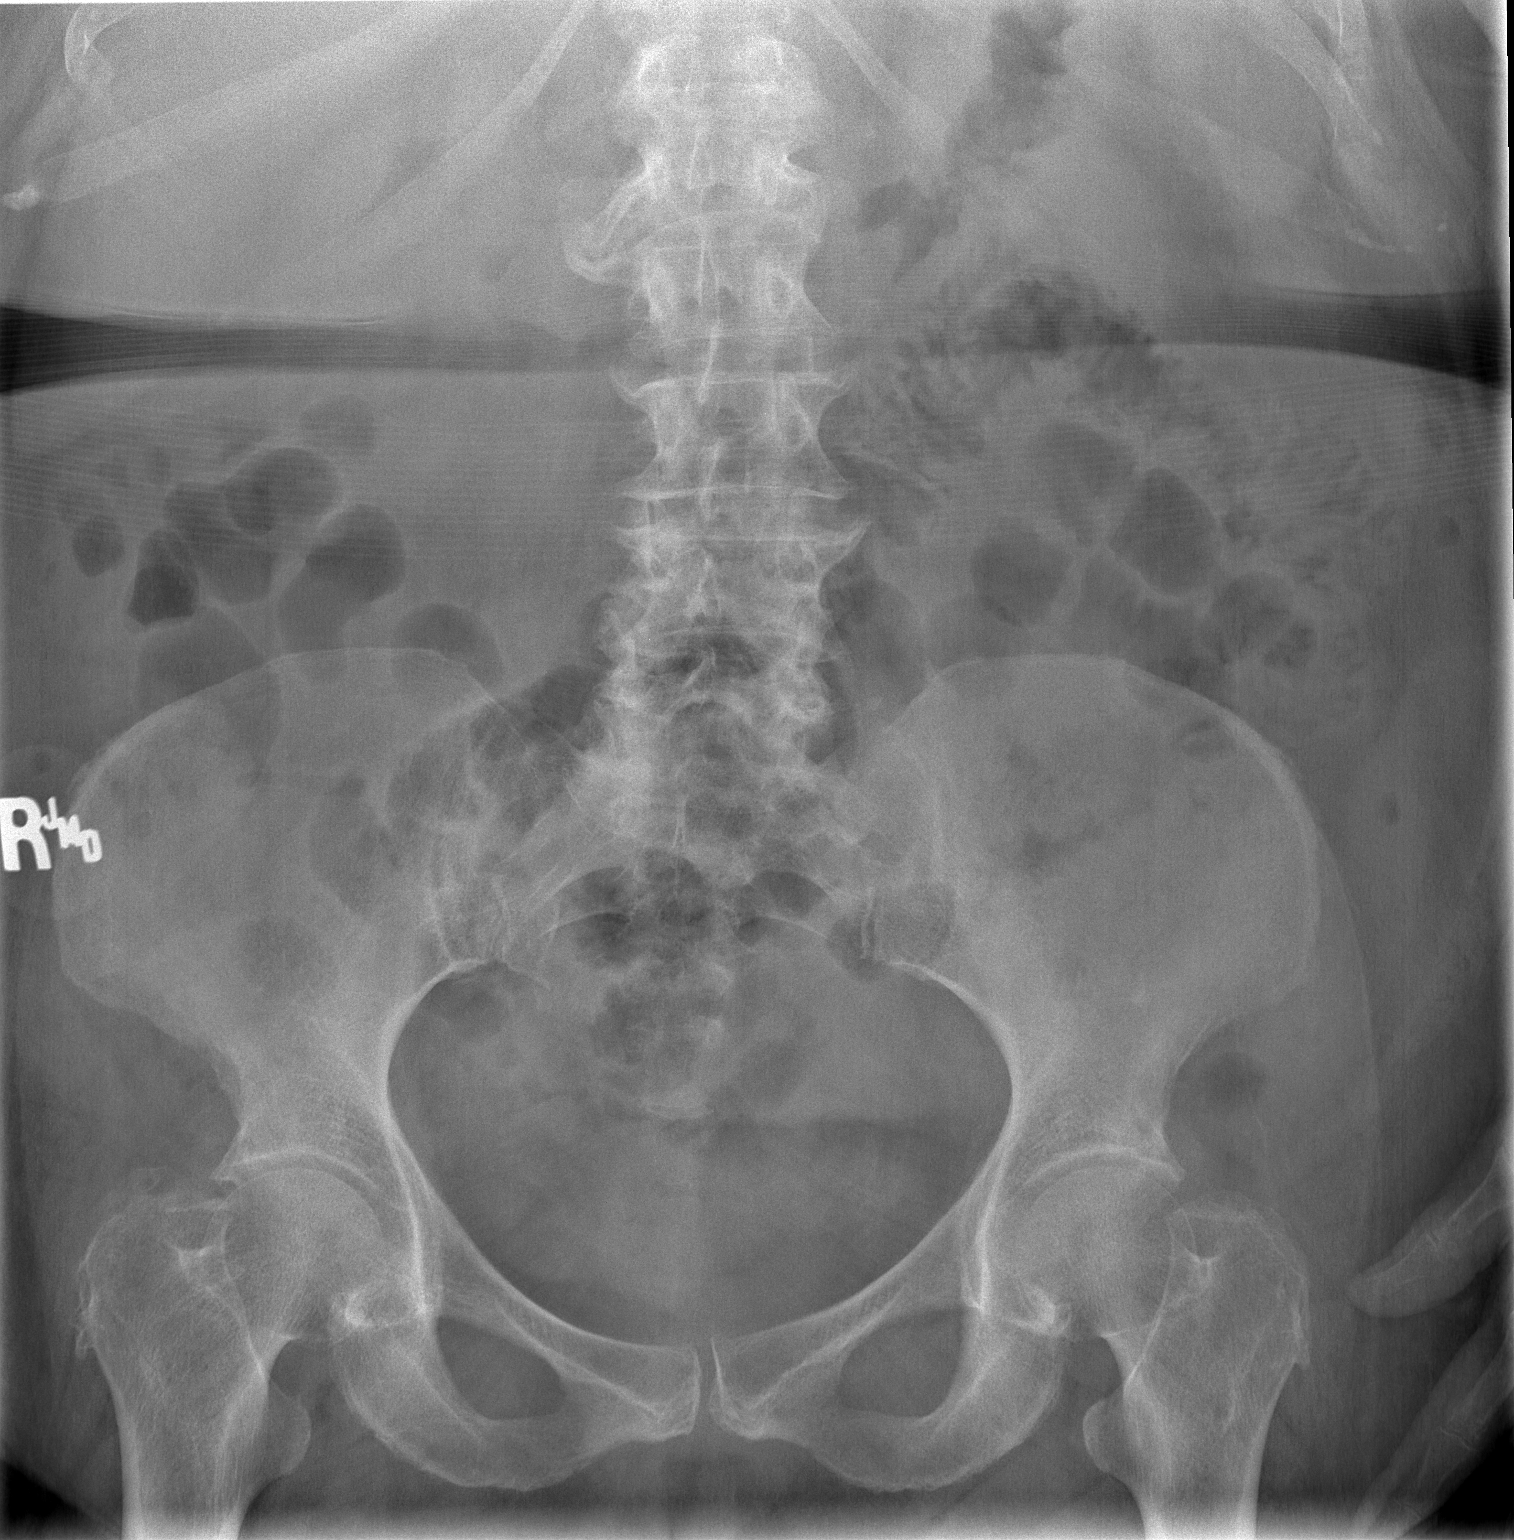

[1 of 1 positions shown; findings below may reference images not displayed]

FINDINGS: The bowel gas pattern is normal. The right renal pelvic
calcification noted on prior exam is not clearly visualized on this
study.
IMPRESSION: Right renal pelvic calculus noted on prior CT exam is not well
visualized currently.

## 2022-09-16 IMAGING — MG MM DIGITAL SCREENING BILAT W/ TOMO AND CAD
6 of 10 series · 6 of 30 positions shown · non-contrast
Comparison: Previous exam(s).

ACR Breast Density Category a: The breast tissue is almost entirely
fatty.

CLINICAL DATA: Screening.

EXAM:
DIGITAL SCREENING BILATERAL MAMMOGRAM WITH TOMOSYNTHESIS AND CAD
TECHNIQUE: Bilateral screening digital craniocaudal and mediolateral oblique
mammograms were obtained. Bilateral screening digital breast
tomosynthesis was performed. The images were evaluated with
computer-aided detection.

[R MLO synth-2D (1 of 2)]
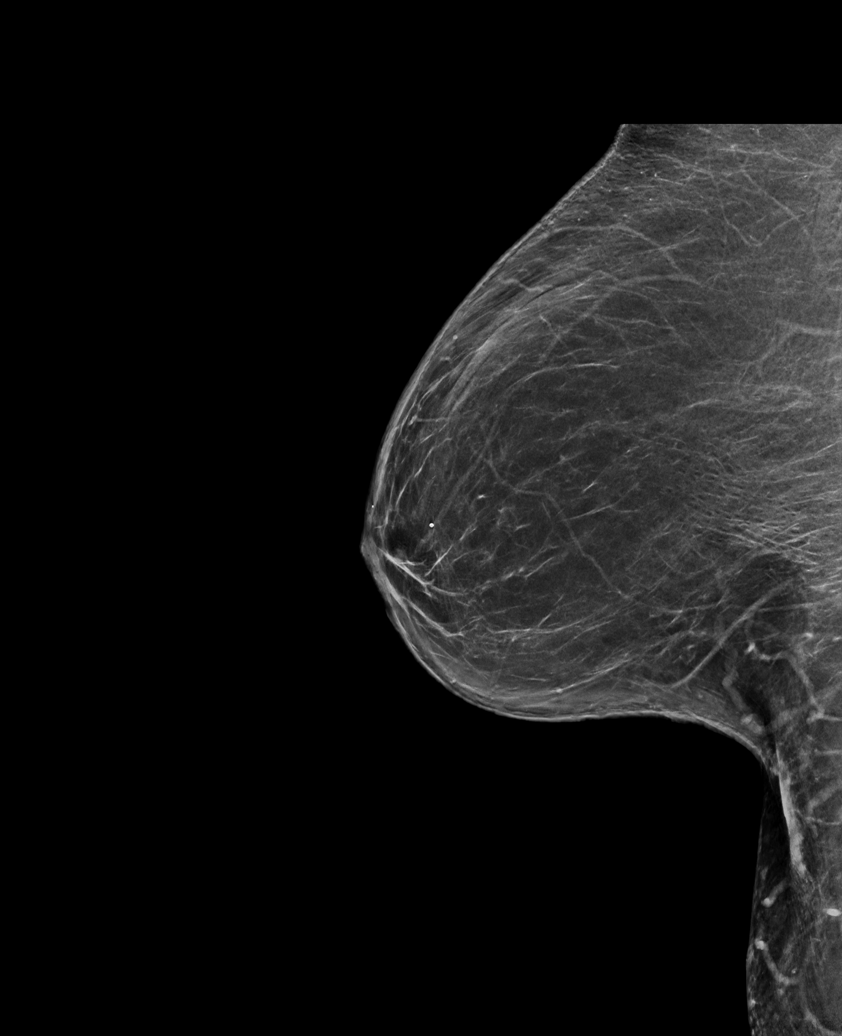

[R CC synth-2D]
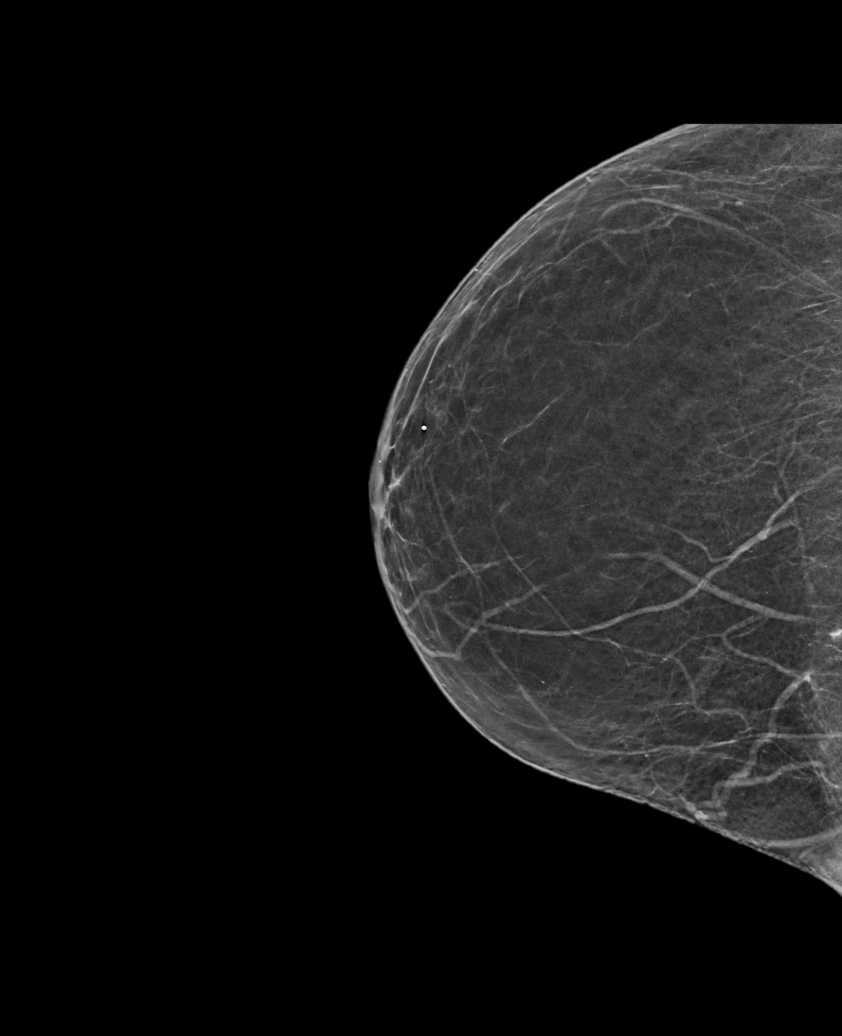

[R MLO synth-2D (2 of 2)]
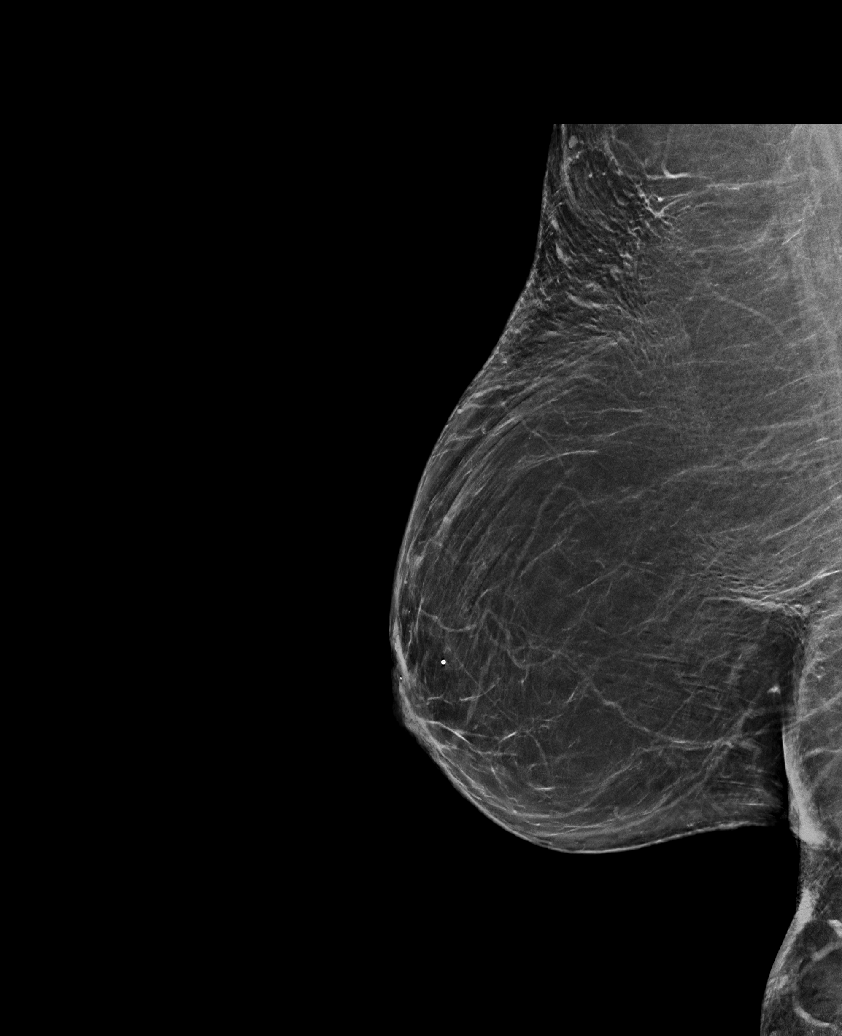

[L CC synth-2D]
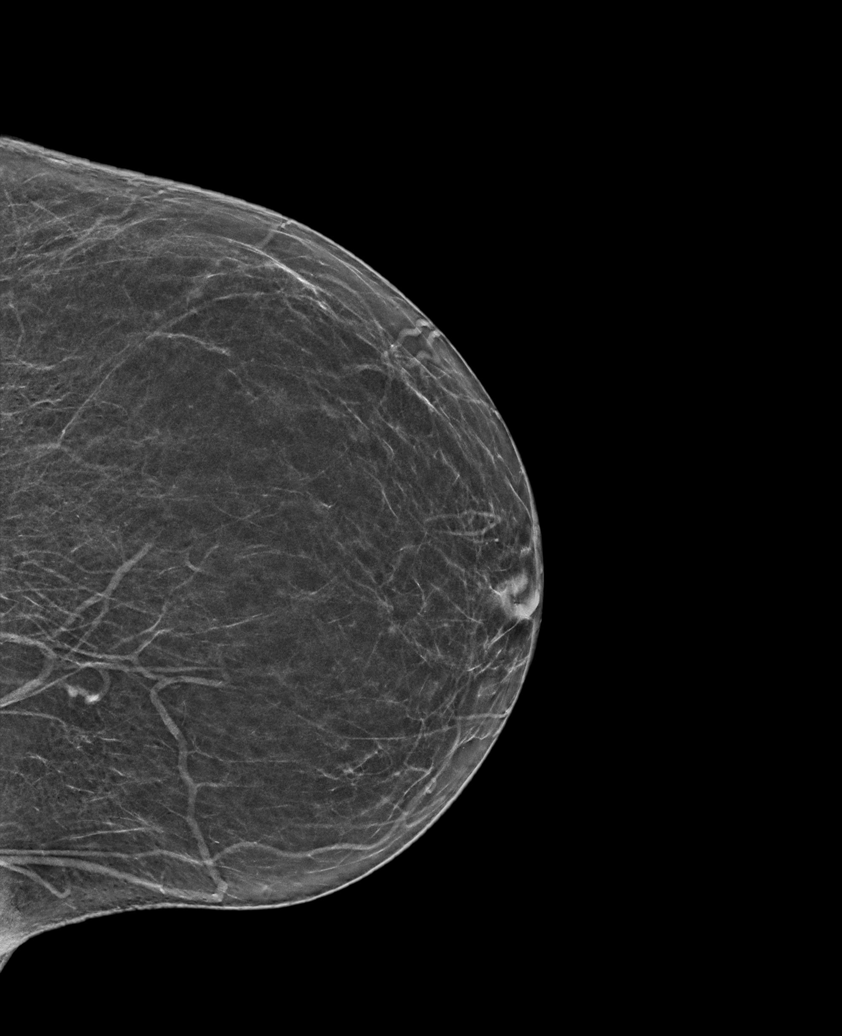

[L MLO synth-2D]
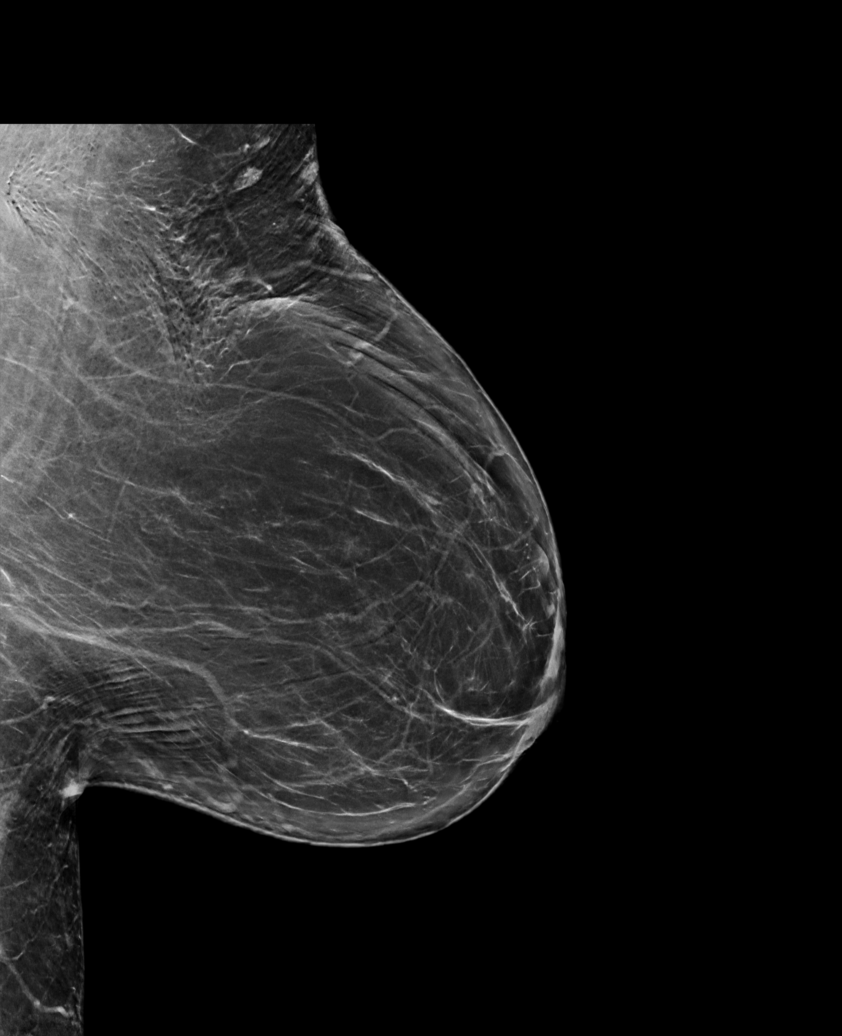

[R CC tomo · tomo slice 27/54.0]
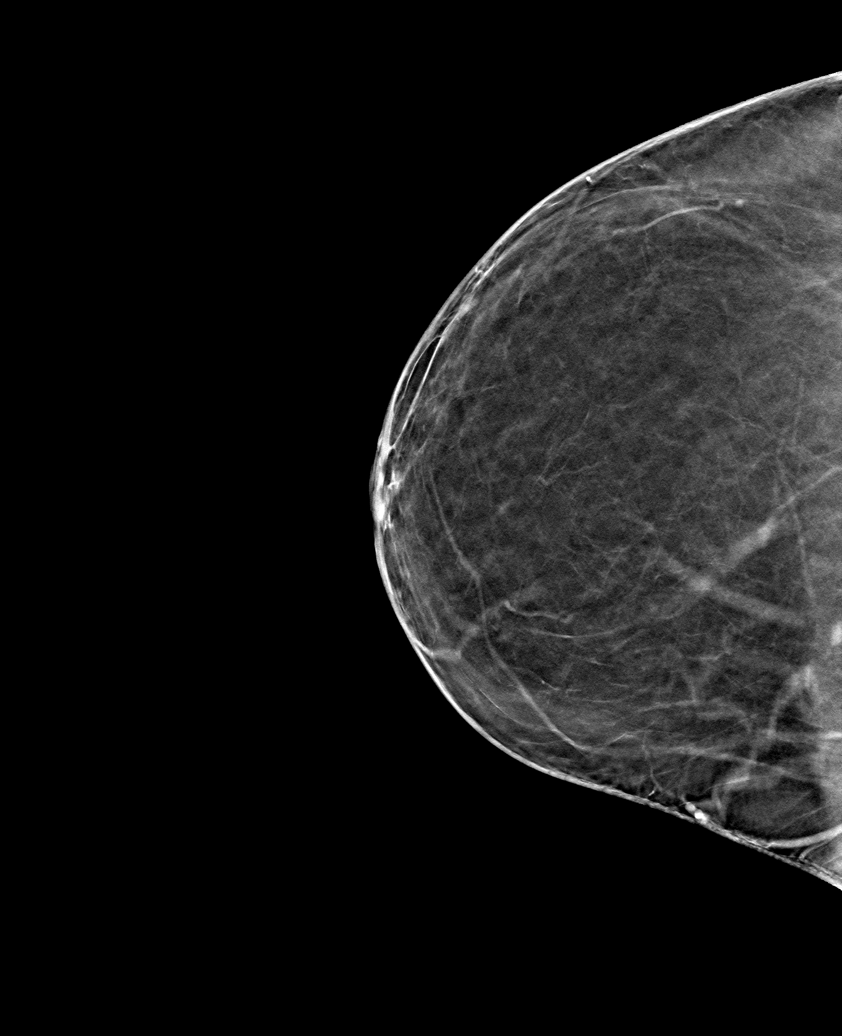

[6 of 30 positions shown; findings below may reference images not displayed]

FINDINGS: There are no findings suspicious for malignancy.
IMPRESSION: No mammographic evidence of malignancy. A result letter of this
screening mammogram will be mailed directly to the patient.

RECOMMENDATION:
Screening mammogram in one year. (Code:0E-3-N98)

BI-RADS CATEGORY  1: Negative.
